# Patient Record
Sex: Female | Born: 1995
Health system: Southern US, Community
[De-identification: ages and names within clinical notes are randomized; demographics above are authoritative.]

## PROBLEM LIST (undated history)

## (undated) DIAGNOSIS — G43909 Migraine, unspecified, not intractable, without status migrainosus: Secondary | ICD-10-CM

## (undated) DIAGNOSIS — L309 Dermatitis, unspecified: Secondary | ICD-10-CM

## (undated) DIAGNOSIS — T7840XA Allergy, unspecified, initial encounter: Secondary | ICD-10-CM

## (undated) DIAGNOSIS — K589 Irritable bowel syndrome without diarrhea: Secondary | ICD-10-CM

## (undated) DIAGNOSIS — F329 Major depressive disorder, single episode, unspecified: Secondary | ICD-10-CM

## (undated) DIAGNOSIS — F32A Depression, unspecified: Secondary | ICD-10-CM

## (undated) HISTORY — DX: Irritable bowel syndrome without diarrhea: K58.9

## (undated) HISTORY — DX: Allergy, unspecified, initial encounter: T78.40XA

## (undated) HISTORY — DX: Migraine, unspecified, not intractable, without status migrainosus: G43.909

## (undated) HISTORY — DX: Depression, unspecified: F32.A

## (undated) HISTORY — DX: Dermatitis, unspecified: L30.9

## (undated) HISTORY — DX: Major depressive disorder, single episode, unspecified: F32.9

---
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
## 2017-01-17 DIAGNOSIS — L2084 Intrinsic (allergic) eczema: Secondary | ICD-10-CM | POA: Diagnosis not present

## 2017-01-26 ENCOUNTER — Encounter: Payer: 59 | Admitting: Physician Assistant

## 2017-01-26 DIAGNOSIS — R51 Headache: Secondary | ICD-10-CM

## 2017-01-29 MED FILL — TRIAMCINOLONE 0.1% CREAM: 0.1 | 30 days supply | Qty: 454 | Fill #0

## 2017-02-02 ENCOUNTER — Ambulatory Visit: Payer: 59 | Admitting: Family Medicine

## 2017-02-02 ENCOUNTER — Ambulatory Visit (INDEPENDENT_AMBULATORY_CARE_PROVIDER_SITE_OTHER): Payer: 59 | Admitting: Family Medicine

## 2017-02-02 ENCOUNTER — Encounter: Payer: Self-pay | Admitting: Family Medicine

## 2017-02-02 VITALS — BP 100/68 | HR 86 | Temp 97.9°F | Resp 16 | Ht 62.0 in | Wt 130.4 lb

## 2017-02-02 DIAGNOSIS — L600 Ingrowing nail: Secondary | ICD-10-CM | POA: Diagnosis not present

## 2017-02-02 DIAGNOSIS — R110 Nausea: Secondary | ICD-10-CM

## 2017-02-02 DIAGNOSIS — N946 Dysmenorrhea, unspecified: Secondary | ICD-10-CM

## 2017-02-02 DIAGNOSIS — K219 Gastro-esophageal reflux disease without esophagitis: Secondary | ICD-10-CM

## 2017-02-02 DIAGNOSIS — K130 Diseases of lips: Secondary | ICD-10-CM | POA: Diagnosis not present

## 2017-02-02 MED ORDER — ONDANSETRON HCL 4 MG PO TABS: 4.0000 mg | ORAL_TABLET | Freq: Three times a day (TID) | ORAL | 1 refills | Status: DC | PRN

## 2017-02-02 MED ORDER — CEPHALEXIN 500 MG PO CAPS: 500.0000 mg | ORAL_CAPSULE | Freq: Three times a day (TID) | ORAL | 0 refills | Status: AC

## 2017-02-02 MED ORDER — NORGESTIMATE-ETH ESTRADIOL 0.25-35 MG-MCG PO TABS: 1.0000 | ORAL_TABLET | Freq: Every day | ORAL | 4 refills | Status: DC

## 2017-02-02 MED FILL — ONDANSETRON HCL 4 MG TABLET: 4 | 10 days supply | Qty: 30 | Fill #0 | Status: TO

## 2017-02-02 MED FILL — CEPHALEXIN 500 MG CAPSULE: 500 | 7 days supply | Qty: 21 | Fill #0

## 2017-02-02 NOTE — Progress Notes (Signed)
   Subjective:    Patient ID: Danielle Hickman, female    DOB: 1995/09/01, 21 y.o.   MRN: [id number]  HPI Birth control- pt needs a refill on meds, leaving for college in 6 days  Ingrown toenail- pt went to a nail salon and continues to have pain, redness and swelling of L great toe  Angular cheilitis- pt has been using OTC 'yeast' cream (Monistat) but 'it's not completely cleared'.  AM nausea- pt asking for refill on Zofran.  Notes increased reflux.  Not taking anything   Review of Systems For ROS see HPI     Objective:   Physical Exam  Constitutional: She is oriented to person, place, and time. She appears well-developed and well-nourished. No distress.  HENT:  Head: Normocephalic and atraumatic.  Mouth/Throat: Oropharynx is clear and moist.  Neurological: She is alert and oriented to person, place, and time.  Skin: Skin is warm and dry. There is erythema (mild erythema along medial nail edge of L great toe w/ TTP and induration but no fluctuance).  Psychiatric: She has a normal mood and affect. Her behavior is normal. Thought content normal.  Vitals reviewed.         Assessment & Plan:  Dysmenorrhea- refill provided on OCPs  Angular Cheilitis- new.  Since Monistat is ineffective will switch to Clotrimazole twice daily.  Pt expressed understanding and is in agreement w/ plan.   Ingrown nail- start Keflex for mild infxn.  Encouraged podiatry f/u when she gets to school in [place] for definitive tx.  Pt expressed understanding and is in agreement w/ plan.   AM nausea- suspect this is due to GERD.  Start Zantac nightly and monitor for improvement.  Pt expressed understanding and is in agreement w/ plan.

## 2017-02-02 NOTE — Patient Instructions (Signed)
Schedule your complete physical for when you are home on break Continue the birth control daily Start the Keflex 3x/day x7 days for the ingrown toenail.  If this continues to occur, please see a podiatrist for definitive treatment Switch to OTC Clotrimazole for the corners of the mouth- apply twice daily Start nightly Ranitidine 175mg  (Zantac) to help w/ AM nausea Call with any questions or concerns GOOD LUCK AT SCHOOL!

## 2017-02-02 NOTE — Progress Notes (Signed)
Pre visit review using our clinic review tool, if applicable. No additional management support is needed unless otherwise documented below in the visit note. 

## 2017-03-05 DIAGNOSIS — Z3041 Encounter for surveillance of contraceptive pills: Secondary | ICD-10-CM | POA: Diagnosis not present

## 2017-03-05 DIAGNOSIS — N761 Subacute and chronic vaginitis: Secondary | ICD-10-CM | POA: Diagnosis not present

## 2017-03-05 DIAGNOSIS — Z113 Encounter for screening for infections with a predominantly sexual mode of transmission: Secondary | ICD-10-CM | POA: Diagnosis not present

## 2017-04-20 DIAGNOSIS — Z23 Encounter for immunization: Secondary | ICD-10-CM | POA: Diagnosis not present

## 2017-05-18 MED FILL — APRI 0.15MG/0.03MG 28 DAY: 0.15-30 | 84 days supply | Qty: 84 | Fill #0 | Status: TO

## 2017-06-14 ENCOUNTER — Encounter: Payer: 59 | Admitting: Family Medicine

## 2017-07-10 ENCOUNTER — Encounter: Payer: Self-pay | Admitting: Family Medicine

## 2017-07-10 ENCOUNTER — Ambulatory Visit (INDEPENDENT_AMBULATORY_CARE_PROVIDER_SITE_OTHER): Payer: 59 | Admitting: Family Medicine

## 2017-07-10 VITALS — BP 120/76 | HR 74 | Temp 98.2°F | Ht 62.0 in | Wt 133.0 lb

## 2017-07-10 DIAGNOSIS — Z0000 Encounter for general adult medical examination without abnormal findings: Secondary | ICD-10-CM

## 2017-07-10 DIAGNOSIS — Z3041 Encounter for surveillance of contraceptive pills: Secondary | ICD-10-CM

## 2017-07-10 NOTE — Progress Notes (Signed)
Subjective  Chief Complaint  Patient presents with  . Annual Exam    Not Fasting    HPI: Danielle Hickman is a 22 y.o. female who presents to [place] [facility] today for a Female Wellness Visit.   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 Spent a year in [place] last year; working and volunteering. LOVED it.  On Ocps without problems. Worries about larger left breast w/o mass or pain. Regular menses. Not currently sexually active but has been. No longer with dysmenorrhea. Has had GC/ch and HIV screen in sept at school; all negative. No high risk behaviors identified.  Parents are separating/divorcing. Feels in the middle and worries about mom who has a drinking problem. Plans on seeing therapist at school to help deal with it, but overall is handling it very well. Maintaining her independence and is very happy at school. Feels supported by parents.  Lifestyle: Body mass index is 24.33 kg/m. Wt Readings from Last 3 Encounters:  07/10/17 133 lb (60.3 kg) (58 %, Z= 0.21)*  02/02/17 130 lb 6 oz (59.1 kg) (55 %, Z= 0.14)*  06/20/16 129 lb (58.5 kg) (56 %, Z= 0.14)*   * Growth percentiles are based on CDC (Girls, 2-20 Years) data.   Diet: low fat Exercise: frequently, hiking Need for contraception: Yes, OCP (estrogen/progesterone)  Patient Active Problem List   Diagnosis Date Noted  . Oral contraceptive use 07/10/2017  . Muscle spasm 01/28/2016  . Physical exam 09/16/2015  . Vulval candidiasis 02/16/2015  . Vaginitis and vulvovaginitis 02/16/2015  . Acute tonsillitis 01/12/2014  . Dysmenorrhea 09/22/2013  . Inattention 07/23/2013  . URI (upper respiratory infection) 10/28/2012  . Depression 08/08/2011   Health Maintenance  Topic Date Due  . Samul Dada  02/25/2019  . INFLUENZA VACCINE  Completed  . HIV Screening  Completed   Immunization  History  Administered Date(s) Administered  . DTaP 10/19/1997, 01/01/1998, 02/25/1998, 02/23/1999, 02/24/2003  . HPV Quadrivalent 06/30/2011, 08/08/2011, 08/28/2012  . Hepatitis A 02/24/2009, 08/08/2011  . Hepatitis B 1995-10-17, 09/13/1997, 05/14/1998  . HiB (PRP-OMP) 10/19/1997, 01/01/1998, 02/23/1999  . IPV 10/19/1997, 01/01/1998, 08/22/1998, 02/24/2003  . Influenza Split 02/23/1999, 05/15/2007, 04/09/2008  . Influenza,inj,Quad PF,6+ Mos 03/26/2013  . Influenza-Unspecified 05/18/2017  . MMR 08/17/1998, 02/24/2003  . Meningococcal Conjugate 08/28/2012  . Pneumococcal Conjugate-13 02/23/1999  . Tdap 02/24/2009  . Varicella 03/23/2004, 02/24/2009   We updated and reviewed the patient's past history in detail and it is documented below. Allergies: Patient has No Known Allergies. Past Medical History Patient  has a past medical history of Allergy, Depression, and Eczema. Past Surgical History Patient  has no past surgical history on file. Family History: Patient family history includes Alcohol abuse in her mother; Cancer in her father; Heart disease in her other and paternal grandfather; Hyperlipidemia in her mother; Hypertension in her maternal grandmother; Migraines in her maternal grandmother and mother; Stroke in her paternal grandfather; Thyroid disease in her father and mother; Valvular heart disease (age of onset: 63) in her father. Social History:  Patient  reports that  has never smoked. she has never used smokeless tobacco. She reports that she does not drink alcohol or use drugs.  Review of Systems: Constitutional: negative for fever or malaise Ophthalmic: negative for photophobia, double vision or loss of vision Cardiovascular: negative for chest pain, dyspnea on exertion, or new LE swelling Respiratory: negative for SOB or  persistent cough Gastrointestinal: negative for abdominal pain, change in bowel habits or melena Genitourinary: negative for dysuria or gross  hematuria, no abnormal uterine bleeding or disharge Musculoskeletal: negative for new gait disturbance or muscular weakness Integumentary: negative for new or persistent rashes, no breast lumps Neurological: negative for TIA or stroke symptoms Psychiatric: negative for SI or delusions Allergic/Immunologic: negative for hives Patient Care Team    Relationship Specialty Notifications Start End  Midge Minium, MD PCP - General Family Medicine  07/19/12    Comment: Merged    Objective  Vitals: BP 120/76 (BP Location: Left Arm, Patient Position: Sitting, Cuff Size: Normal)   Pulse 74   Temp 98.2 F (36.8 C) (Oral)   Ht 5' 2"  (1.575 m)   Wt 133 lb (60.3 kg)   LMP 07/10/2017   SpO2 99%   BMI 24.33 kg/m  General:  Well developed, well nourished, no acute distress  Psych:  Alert and orientedx3,normal mood and affect HEENT:  Normocephalic, atraumatic, non-icteric sclera, PERRL, oropharynx is clear without mass or exudate, supple neck without adenopathy, mass or thyromegaly Cardiovascular:  Normal S1, S2, RRR without gallop, rub or murmur, nondisplaced PMI Respiratory:  Good breath sounds bilaterally, CTAB with normal respiratory effort Gastrointestinal: normal bowel sounds, soft, non-tender, no noted masses. No HSM MSK: no deformities, contusions. Joints are without erythema or swelling. Spine and CVA region are nontender Skin:  Warm, no rashes or suspicious lesions noted Neurologic:    Mental status is normal. CN 2-11 are normal. Gross motor and sensory exams are normal. Normal gait. No tremor Breast Exam: No mass, skin retraction or nipple discharge is appreciated in either breast. assymetric breast tissue, No axillary adenopathy. Fibrocystic changes are not noted Pelvic Exam: Normal external genitalia, no vulvar or vaginal lesions present. Clear cervix w/o CMT. Bimanual exam reveals a nontender fundus w/o masses, nl size. No adnexal masses present. No inguinal adenopathy. A PAP smear  was not performed.   Assessment  1. Physical exam   2. Oral contraceptive use      Plan  Female Wellness Visit:  Age appropriate Health Maintenance and Prevention measures were discussed with patient. Included topics are cancer screening recommendations, ways to keep healthy (see AVS) including dietary and exercise recommendations, regular eye and dental care, use of seat belts, and avoidance of moderate alcohol use and tobacco use.   BMI: discussed patient's BMI and encouraged positive lifestyle modifications to help get to or maintain a target BMI.  HM needs and immunizations were addressed and ordered. See below for orders. See HM and immunization section for updates.  Routine labs and screening tests ordered including cmp, cbc and lipids where appropriate.  Discussed recommendations regarding Vit D and calcium supplementation (see AVS)  Follow up: Return in about 12 months (around 07/10/2018) for complete physical.    Commons side effects, risks, benefits, and alternatives for medications and treatment plan prescribed today were discussed, and the patient expressed understanding of the given instructions. Patient is instructed to call or message via MyChart if he/she has any questions or concerns regarding our treatment plan. No barriers to understanding were identified. We discussed Red Flag symptoms and signs in detail. Patient expressed understanding regarding what to do in case of urgent or emergency type symptoms.   Medication list was reconciled, printed and provided to the patient in AVS. Patient instructions and summary information was reviewed with the patient as documented in the AVS. This note was prepared with assistance of Dragon voice recognition  software. Occasional wrong-word or sound-a-like substitutions may have occurred due to the inherent limitations of voice recognition software

## 2017-07-10 NOTE — Patient Instructions (Signed)
Please return in 1 year for your physical.  If you have any questions or concerns, please don't hesitate to send me a message via MyChart or call the office at [phone]. Thank you for visiting with [place] today! It's our pleasure caring for you.  Please do these things to maintain good health!   Exercise at least 30-45 minutes a day,  4-5 days a week.   Eat a low-fat diet with lots of fruits and vegetables, up to 7-9 servings per day.  Drink plenty of water daily. Try to drink 8 8oz glasses per day.  Seatbelts can save your life. Always wear your seatbelt.  Place Smoke Detectors on every level of your home and check batteries every year.  Schedule an appointment with an eye doctor for an eye exam every 1-2 years  Safe sex - use condoms to protect yourself from STDs if you could be exposed to these types of infections. Use birth control if you do not want to become pregnant and are sexually active.  Avoid heavy alcohol use. If you drink, keep it to less than 2 drinks/day and not every day.  [place].  Choose someone you trust that could speak for you if you became unable to speak for yourself.  Depression is common in our stressful world.If you're feeling down or losing interest in things you normally enjoy, please come in for a visit.  If anyone is threatening or hurting you, please get help. Physical or Emotional Violence is never OK.

## 2017-07-11 ENCOUNTER — Encounter: Payer: Self-pay | Admitting: Allergy and Immunology

## 2017-07-11 ENCOUNTER — Ambulatory Visit (INDEPENDENT_AMBULATORY_CARE_PROVIDER_SITE_OTHER): Payer: 59 | Admitting: Allergy and Immunology

## 2017-07-11 VITALS — BP 100/64 | HR 70 | Temp 98.2°F | Resp 16 | Ht 61.69 in | Wt 125.9 lb

## 2017-07-11 DIAGNOSIS — T781XXD Other adverse food reactions, not elsewhere classified, subsequent encounter: Secondary | ICD-10-CM | POA: Diagnosis not present

## 2017-07-11 DIAGNOSIS — T7800XD Anaphylactic reaction due to unspecified food, subsequent encounter: Secondary | ICD-10-CM

## 2017-07-11 DIAGNOSIS — T7800XA Anaphylactic reaction due to unspecified food, initial encounter: Secondary | ICD-10-CM | POA: Insufficient documentation

## 2017-07-11 DIAGNOSIS — J3089 Other allergic rhinitis: Secondary | ICD-10-CM

## 2017-07-11 DIAGNOSIS — T7819XA Other adverse food reactions, not elsewhere classified, initial encounter: Secondary | ICD-10-CM | POA: Insufficient documentation

## 2017-07-11 DIAGNOSIS — T781XXA Other adverse food reactions, not elsewhere classified, initial encounter: Secondary | ICD-10-CM | POA: Insufficient documentation

## 2017-07-11 DIAGNOSIS — H1013 Acute atopic conjunctivitis, bilateral: Secondary | ICD-10-CM | POA: Diagnosis not present

## 2017-07-11 DIAGNOSIS — H1010 Acute atopic conjunctivitis, unspecified eye: Secondary | ICD-10-CM | POA: Insufficient documentation

## 2017-07-11 MED ORDER — FLUTICASONE PROPIONATE 50 MCG/ACT NA SUSP: NASAL | 5 refills | Status: AC

## 2017-07-11 MED ORDER — OLOPATADINE HCL 0.2 % OP SOLN: 1.0000 [drp] | Freq: Every day | OPHTHALMIC | 5 refills | Status: DC | PRN

## 2017-07-11 MED ORDER — LEVOCETIRIZINE DIHYDROCHLORIDE 5 MG PO TABS: 5.0000 mg | ORAL_TABLET | Freq: Every evening | ORAL | 5 refills | Status: AC

## 2017-07-11 MED ORDER — EPINEPHRINE 0.3 MG/0.3ML IJ SOAJ: INTRAMUSCULAR | 3 refills | Status: AC

## 2017-07-11 NOTE — Progress Notes (Signed)
New Patient Note  RE: Danielle Hickman MRN: [id number] DOB: 09/13/1995 Date of Office Visit: 07/11/2017  Referring provider: Midge Minium, MD Primary care provider: Midge Minium, MD  Chief Complaint: Allergic Reaction; Food Intolerance; and Allergic Rhinitis    History of present illness: Danielle Hickman is a 22 y.o. female seen today in consultation requested by Annye Asa, MD.  She complains of oral pruritus with certain fruits.  She states that this all started approximately 3 years ago with raw apples.  She notes that she is able to consume cooked fruits, such as apple pie, without symptoms.  Over these past 3 years, the problem has progressed from apples to multiple fruits, including cherry, weekly, peach, pear, and plums.  In addition, she has oral pruritus with consumption of almonds and walnuts and soy milk.  On one occasion, early December 2018, she consumed 4 strawberries and experienced pain in her throat, ears, and chest.  She had elevated heart rate, became short of breath, developed nausea, and vomited.  She took diphenhydramine and went to bed.  When she woke up she had no symptoms. [place] experiences nasal congestion, rhinorrhea, sneezing, nasal pruritus, and ocular pruritus.  These symptoms are most frequent and severe in the springtime.  When the symptoms are severe, she attempts to control them with cetirizine as needed.   Assessment and plan: Food allergy The patient's history suggests food allergy and positive skin test results today confirm this diagnosis.    Meticulous avoidance of tree nuts, sesame seed, and soy as discussed.  In addition, she will avoid any fruits which cause untoward symptoms.  A prescription has been provided for epinephrine auto-injector 2 pack along with instructions for proper administration.  A food allergy action plan has been provided and discussed.  Medic Alert identification is recommended.  Oral allergy  syndrome Oral allergy syndrome.  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 Peeling or cooking the food has shown to reduce symptoms and antihistamines may also relieve symptoms. Immunotherapy to the cross reacting pollens has improved or cured OAS in many patients, though this has not been consistent for all patients. Typically OAS is limited to itching or swelling of mucosal tissues from the lips to the back of the throat.   Information about OAS has been discussed and provided in written form.  All foods causing symptoms are to be avoided.  Should symptoms progress beyond the mouth and throat, epinephrine is to be administered and 911 is to be called immediately.  Seasonal and perennial allergic rhinitis  Aeroallergen avoidance measures have been discussed and provided in written form.  A prescription has been provided for levocetirizine, 5 mg daily as needed.  A prescription has been provided for fluticasone nasal spray, one spray per nostril 1-2 times daily as needed. Proper nasal spray technique has been discussed and demonstrated.  Nasal saline spray (i.e., Simply Saline) or nasal saline lavage (i.e., NeilMed) is recommended as needed and prior to medicated nasal sprays.  If allergen avoidance measures and medications fail to adequately relieve symptoms, aeroallergen immunotherapy will be considered.  Allergic conjunctivitis  Treatment plan as outlined above for allergic rhinitis.  A prescription has been provided for Pataday, one drop per eye daily as needed.  I have also recommended eye lubricant drops (i.e., Natural Tears) as needed.   Meds ordered this encounter  Medications  . EPINEPHrine (AUVI-Q) 0.3 mg/0.3 mL IJ SOAJ injection  Sig: Use as directed for severe allergic reactions    Dispense:  4 Device    Refill:  3  . levocetirizine (XYZAL) 5 MG tablet    Sig: Take 1 tablet (5 mg total) by mouth every evening. As  needed    Dispense:  30 tablet    Refill:  5  . fluticasone (FLONASE) 50 MCG/ACT nasal spray    Sig: 1 spray 1-2 times a day as needed    Dispense:  16 g    Refill:  5  . Olopatadine HCl (PATADAY) 0.2 % SOLN    Sig: Apply 1 drop to eye daily as needed.    Dispense:  1 Bottle    Refill:  5    Diagnostics: Environmental skin testing: Positive to grass pollen, weed pollen, ragweed pollen, tree pollen, and dust mite antigen. Food allergen skin testing: Positive to soybean, pecan, walnut, almond, hazelnut, [place] nut, and sesame seed.    Physical examination: Blood pressure 100/64, pulse 70, temperature 98.2 F (36.8 C), temperature source Oral, resp. rate 16, height 5' 1.69" (1.567 m), weight 125 lb 14.1 oz (57.1 kg), last menstrual period 07/10/2017, SpO2 97 %.  General: Alert, interactive, in no acute distress. HEENT: TMs pearly gray, turbinates edematous with thick discharge, post-pharynx moderately erythematous. Neck: Supple without lymphadenopathy. Lungs: Clear to auscultation without wheezing, rhonchi or rales. CV: Normal S1, S2 without murmurs. Abdomen: Nondistended, nontender. Skin: Warm and dry, without lesions or rashes. Extremities:  No clubbing, cyanosis or edema. Neuro:   Grossly intact.  Review of systems:  Review of systems negative except as noted in HPI / PMHx or noted below: Review of Systems  Constitutional: Negative.   HENT: Negative.   Eyes: Negative.   Respiratory: Negative.   Cardiovascular: Negative.   Gastrointestinal: Negative.   Genitourinary: Negative.   Musculoskeletal: Negative.   Skin: Negative.   Neurological: Negative.   Endo/Heme/Allergies: Negative.   Psychiatric/Behavioral: Negative.     Past medical history:  Past Medical History:  Diagnosis Date  . Allergy   . Depression   . Eczema     Past surgical history:  History reviewed. No pertinent surgical history.  Family history: Family History  Problem Relation Age of Onset   . Migraines Mother   . Hyperlipidemia Mother   . Thyroid disease Mother   . Alcohol abuse Mother   . Allergic rhinitis Mother   . Thyroid disease Father        graves disease  . Cancer Father        colon  . Valvular heart disease Father 9  . Hypertension Maternal Grandmother   . Migraines Maternal Grandmother   . Heart disease Paternal Grandfather        AI---valve replacement  . Stroke Paternal Grandfather   . Heart disease Other        AI--valve replacement    Social history: Social History   Socioeconomic History  . Marital status: Single    Spouse name: Not on file  . Number of children: Not on file  . Years of education: Not on file  . Highest education level: Not on file  Social Needs  . Financial resource strain: Not on file  . Food insecurity - worry: Not on file  . Food insecurity - inability: Not on file  . Transportation needs - medical: Not on file  . Transportation needs - non-medical: Not on file  Occupational History  . Occupation: Systems analyst  Tobacco Use  .  Smoking status: Never Smoker  . Smokeless tobacco: Never Used  Substance and Sexual Activity  . Alcohol use: No  . Drug use: No  . Sexual activity: Not Currently    Birth control/protection: Condom, Pill  Other Topics Concern  . Not on file  Social History Narrative  . Not on file   Environmental History: The patient lives in a 20 year old house with hardwood floors throughout and central air/heat.  There is a cat in the house which has access to her bedroom.  She is a non-smoker.  There is mold/water damage in the home.  Allergies as of 07/11/2017      Reactions   Apple Itching, Swelling   Cherry    Kiwi Extract Hives, Itching, Swelling   Other Hives, Itching, Swelling   Allergic to walnut and almond   Peach [prunus Persica] Hives, Itching, Swelling   Pear    Plum Pulp Itching, Swelling      Medication List        Accurate as of 07/11/17  3:40 PM. Always  use your most recent med list.          EPINEPHrine 0.3 mg/0.3 mL Soaj injection Commonly known as:  AUVI-Q Use as directed for severe allergic reactions   fluticasone 50 MCG/ACT nasal spray Commonly known as:  FLONASE 1 spray 1-2 times a day as needed   IBUPROFEN IB PO Take by mouth as needed.   levocetirizine 5 MG tablet Commonly known as:  XYZAL Take 1 tablet (5 mg total) by mouth every evening. As needed   norgestimate-ethinyl estradiol 0.25-35 MG-MCG tablet Commonly known as:  MONO-LINYAH Take 1 tablet by mouth daily.   Olopatadine HCl 0.2 % Soln Commonly known as:  PATADAY Apply 1 drop to eye daily as needed.   ondansetron 4 MG tablet Commonly known as:  ZOFRAN Take 1 tablet (4 mg total) by mouth every 8 (eight) hours as needed for nausea or vomiting.       Known medication allergies: Allergies  Allergen Reactions  . Apple Itching and Swelling  . Ong   . Kiwi Extract Hives, Itching and Swelling  . Other Hives, Itching and Swelling    Allergic to walnut and almond  . Peach [Prunus Persica] Hives, Itching and Swelling  . Pear   . Plum Pulp Itching and Swelling    I appreciate the opportunity to take part in [facility] care. Please do not hesitate to contact me with questions.  Sincerely,   R. Edgar Frisk, MD

## 2017-07-11 NOTE — Assessment & Plan Note (Addendum)
The patient's history suggests food allergy and positive skin test results today confirm this diagnosis.    Meticulous avoidance of tree nuts, sesame seed, and soy as discussed.  In addition, she will avoid any fruits which cause untoward symptoms.  A prescription has been provided for epinephrine auto-injector 2 pack along with instructions for proper administration.  A food allergy action plan has been provided and discussed.  Medic Alert identification is recommended.

## 2017-07-11 NOTE — Assessment & Plan Note (Signed)
   Aeroallergen avoidance measures have been discussed and provided in written form.  A prescription has been provided for levocetirizine, 5 mg daily as needed.  A prescription has been provided for fluticasone nasal spray, one spray per nostril 1-2 times daily as needed. Proper nasal spray technique has been discussed and demonstrated.  Nasal saline spray (i.e., Simply Saline) or nasal saline lavage (i.e., NeilMed) is recommended as needed and prior to medicated nasal sprays.  If allergen avoidance measures and medications fail to adequately relieve symptoms, aeroallergen immunotherapy will be considered. 

## 2017-07-11 NOTE — Assessment & Plan Note (Signed)
Oral allergy syndrome.  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 Peeling or cooking the food has shown to reduce symptoms and antihistamines may also relieve symptoms. Immunotherapy to the cross reacting pollens has improved or cured OAS in many patients, though this has not been consistent for all patients. Typically OAS is limited to itching or swelling of mucosal tissues from the lips to the back of the throat.   Information about OAS has been discussed and provided in written form.  All foods causing symptoms are to be avoided.  Should symptoms progress beyond the mouth and throat, epinephrine is to be administered and 911 is to be called immediately. 

## 2017-07-11 NOTE — Patient Instructions (Addendum)
Food allergy The patient's history suggests food allergy and positive skin test results today confirm this diagnosis.    Meticulous avoidance of tree nuts, sesame seed, and soy as discussed.  In addition, she will avoid any fruits which cause untoward symptoms.  A prescription has been provided for epinephrine auto-injector 2 pack along with instructions for proper administration.  A food allergy action plan has been provided and discussed.  Medic Alert identification is recommended.  Oral allergy syndrome Oral allergy syndrome.  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 Peeling or cooking the food has shown to reduce symptoms and antihistamines may also relieve symptoms. Immunotherapy to the cross reacting pollens has improved or cured OAS in many patients, though this has not been consistent for all patients. Typically OAS is limited to itching or swelling of mucosal tissues from the lips to the back of the throat.   Information about OAS has been discussed and provided in written form.  All foods causing symptoms are to be avoided.  Should symptoms progress beyond the mouth and throat, epinephrine is to be administered and 911 is to be called immediately.  Seasonal and perennial allergic rhinitis  Aeroallergen avoidance measures have been discussed and provided in written form.  A prescription has been provided for levocetirizine, 5 mg daily as needed.  A prescription has been provided for fluticasone nasal spray, one spray per nostril 1-2 times daily as needed. Proper nasal spray technique has been discussed and demonstrated.  Nasal saline spray (i.e., Simply Saline) or nasal saline lavage (i.e., NeilMed) is recommended as needed and prior to medicated nasal sprays.  If allergen avoidance measures and medications fail to adequately relieve symptoms, aeroallergen immunotherapy will be considered.  Allergic  conjunctivitis  Treatment plan as outlined above for allergic rhinitis.  A prescription has been provided for Pataday, one drop per eye daily as needed.  I have also recommended eye lubricant drops (i.e., Natural Tears) as needed.   Return in about 1 year (around 07/11/2018), or if symptoms worsen or fail to improve.  Reducing Pollen Exposure  The American Academy of Allergy, Asthma and Immunology suggests the following steps to reduce your exposure to pollen during allergy seasons.    1. Do not hang sheets or clothing out to dry; pollen may collect on these items. 2. Do not mow lawns or spend time around freshly cut grass; mowing stirs up pollen. 3. Keep windows closed at night.  Keep car windows closed while driving. 4. Minimize morning activities outdoors, a time when pollen counts are usually at their highest. 5. Stay indoors as much as possible when pollen counts or humidity is high and on windy days when pollen tends to remain in the air longer. 6. Use air conditioning when possible.  Many air conditioners have filters that trap the pollen spores. 7. Use a HEPA room air filter to remove pollen form the indoor air you breathe.   Control of House Dust Mite Allergen  House dust mites play a major role in allergic asthma and rhinitis.  They occur in environments with high humidity wherever human skin, the food for dust mites is found. High levels have been detected in dust obtained from mattresses, pillows, carpets, upholstered furniture, bed covers, clothes and soft toys.  The principal allergen of the house dust mite is found in its feces.  A gram of dust may contain 1,000 mites and 250,000 fecal particles.  Mite antigen is easily measured  in the air during house cleaning activities.    1. Encase mattresses, including the box spring, and pillow, in an air tight cover.  Seal the zipper end of the encased mattresses with wide adhesive tape. 2. Wash the bedding in water of 130 degrees  Farenheit weekly.  Avoid cotton comforters/quilts and flannel bedding: the most ideal bed covering is the dacron comforter. 3. Remove all upholstered furniture from the bedroom. 4. Remove carpets, carpet padding, rugs, and non-washable window drapes from the bedroom.  Wash drapes weekly or use plastic window coverings. 5. Remove all non-washable stuffed toys from the bedroom.  Wash stuffed toys weekly. 6. Have the room cleaned frequently with a vacuum cleaner and a damp dust-mop.  The patient should not be in a room which is being cleaned and should wait 1 hour after cleaning before going into the room. 7. Close and seal all heating outlets in the bedroom.  Otherwise, the room will become filled with dust-laden air.  An electric heater can be used to heat the room. 8. Reduce indoor humidity to less than 50%.  Do not use a humidifier.

## 2017-07-11 NOTE — Assessment & Plan Note (Signed)
   Treatment plan as outlined above for allergic rhinitis.  A prescription has been provided for Pataday, one drop per eye daily as needed.  I have also recommended eye lubricant drops (i.e., Natural Tears) as needed. 

## 2017-07-12 MED FILL — OLOPATADINE HCL 0.2 % SOLN: 0.2 | 25 days supply | Qty: 3 | Fill #0

## 2017-07-12 MED FILL — LEVOCETIRIZINE 5 MG TABLET: 5 | 30 days supply | Qty: 30 | Fill #0

## 2017-07-12 MED FILL — FLUTICASONE PROP 50 MCG SPR: 50 | 30 days supply | Qty: 16 | Fill #0

## 2017-07-16 ENCOUNTER — Ambulatory Visit: Payer: 59 | Admitting: Allergy and Immunology

## 2017-07-22 DIAGNOSIS — F418 Other specified anxiety disorders: Secondary | ICD-10-CM | POA: Diagnosis not present

## 2017-07-22 DIAGNOSIS — T7840XA Allergy, unspecified, initial encounter: Secondary | ICD-10-CM | POA: Diagnosis not present

## 2017-07-22 DIAGNOSIS — R197 Diarrhea, unspecified: Secondary | ICD-10-CM | POA: Diagnosis not present

## 2017-07-22 DIAGNOSIS — R112 Nausea with vomiting, unspecified: Secondary | ICD-10-CM | POA: Diagnosis not present

## 2017-07-23 DIAGNOSIS — T7840XA Allergy, unspecified, initial encounter: Secondary | ICD-10-CM | POA: Diagnosis not present

## 2017-09-03 DIAGNOSIS — J4540 Moderate persistent asthma, uncomplicated: Secondary | ICD-10-CM | POA: Diagnosis not present

## 2017-09-03 DIAGNOSIS — J301 Allergic rhinitis due to pollen: Secondary | ICD-10-CM | POA: Diagnosis not present

## 2017-09-03 DIAGNOSIS — L209 Atopic dermatitis, unspecified: Secondary | ICD-10-CM | POA: Diagnosis not present

## 2017-09-03 DIAGNOSIS — Z91018 Allergy to other foods: Secondary | ICD-10-CM | POA: Diagnosis not present

## 2017-09-03 DIAGNOSIS — G43909 Migraine, unspecified, not intractable, without status migrainosus: Secondary | ICD-10-CM | POA: Diagnosis not present

## 2017-09-04 DIAGNOSIS — Z91018 Allergy to other foods: Secondary | ICD-10-CM | POA: Diagnosis not present

## 2017-09-13 ENCOUNTER — Encounter: Payer: Self-pay | Admitting: Family Medicine

## 2017-09-13 ENCOUNTER — Ambulatory Visit (INDEPENDENT_AMBULATORY_CARE_PROVIDER_SITE_OTHER): Payer: 59 | Admitting: Family Medicine

## 2017-09-13 ENCOUNTER — Other Ambulatory Visit (HOSPITAL_COMMUNITY)
Admission: RE | Admit: 2017-09-13 | Discharge: 2017-09-13 | Disposition: A | Discharge status: Home / Self Care | Payer: 59 | Source: Ambulatory Visit | Attending: Family Medicine | Admitting: Family Medicine

## 2017-09-13 ENCOUNTER — Other Ambulatory Visit: Payer: Self-pay

## 2017-09-13 VITALS — BP 100/70 | HR 67 | Temp 98.2°F | Resp 16 | Ht 62.0 in | Wt 126.0 lb

## 2017-09-13 DIAGNOSIS — F329 Major depressive disorder, single episode, unspecified: Secondary | ICD-10-CM | POA: Diagnosis not present

## 2017-09-13 DIAGNOSIS — R5383 Other fatigue: Secondary | ICD-10-CM

## 2017-09-13 DIAGNOSIS — F32A Depression, unspecified: Secondary | ICD-10-CM

## 2017-09-13 DIAGNOSIS — Z202 Contact with and (suspected) exposure to infections with a predominantly sexual mode of transmission: Secondary | ICD-10-CM | POA: Diagnosis not present

## 2017-09-13 LAB — CBC WITH DIFFERENTIAL/PLATELET
Basophils Absolute: 0 10*3/uL (ref 0.0–0.1)
Basophils Relative: 0.7 % (ref 0.0–3.0)
Eosinophils Absolute: 0.3 10*3/uL (ref 0.0–0.7)
Eosinophils Relative: 4 % (ref 0.0–5.0)
HCT: 41.3 % (ref 36.0–46.0)
Hemoglobin: 14.1 g/dL (ref 12.0–15.0)
Lymphocytes Relative: 27.3 % (ref 12.0–46.0)
Lymphs Abs: 1.8 10*3/uL (ref 0.7–4.0)
MCHC: 34.1 g/dL (ref 30.0–36.0)
MCV: 89.1 fl (ref 78.0–100.0)
Monocytes Absolute: 0.4 10*3/uL (ref 0.1–1.0)
Monocytes Relative: 6.3 % (ref 3.0–12.0)
Neutro Abs: 4.1 10*3/uL (ref 1.4–7.7)
Neutrophils Relative %: 61.7 % (ref 43.0–77.0)
Platelets: 288 10*3/uL (ref 150.0–400.0)
RBC: 4.64 Mil/uL (ref 3.87–5.11)
RDW: 12.5 % (ref 11.5–14.6)
WBC: 6.7 10*3/uL (ref 4.5–10.5)

## 2017-09-13 LAB — B12 AND FOLATE PANEL
Folate: 13 ng/mL (ref >5.9)
Vitamin B-12: 182 pg/mL — ABNORMAL LOW (ref 211–911)

## 2017-09-13 LAB — TSH: TSH: 2.75 u[IU]/mL (ref 0.35–5.50)

## 2017-09-13 LAB — VITAMIN D 25 HYDROXY (VIT D DEFICIENCY, FRACTURES): VITD: 22.72 ng/mL — ABNORMAL LOW (ref 30.00–100.00)

## 2017-09-13 NOTE — Progress Notes (Signed)
   Subjective:    Patient ID: Danielle Hickman, female    DOB: 1996-05-10, 22 y.o.   MRN: [id number]  HPI Pt was dx'd w/ multiple food allergies in January.  Pt has been extremely stressed b/c allergist told her, 'don't go to sleep after taking Benadryl bc the Benadryl can wear off and the reaction can kill you'  This has been 'the framework of my anxiety for the last few months'.  Then found out that parents are separating.  Returned to college and 'it was WAY harder'.  Pt is now having school anxiety, food anxiety, and stress at home.  'I don't feel safe in my own body'.  Waking up w/ headaches that don't improve w/ ibuprofen or zofran.  She doesn't describe them as migraines.  'very crippling anxiety'.  Pt is seeing a counselor in [place] who recommended she be checked for thyroid issues b/c of ongoing fatigue.  Pt has an appt upcoming w/ psychiatrist.   Review of Systems For ROS see HPI     Objective:   Physical Exam  Constitutional: She is oriented to person, place, and time. She appears well-developed and well-nourished. No distress.  HENT:  Head: Normocephalic and atraumatic.  Eyes: Pupils are equal, round, and reactive to light. Conjunctivae and EOM are normal.  Neck: Normal range of motion. Neck supple. No thyromegaly present.  Cardiovascular: Normal rate, regular rhythm, normal heart sounds and intact distal pulses.  No murmur heard. Pulmonary/Chest: Effort normal and breath sounds normal. No respiratory distress.  Musculoskeletal: She exhibits no edema.  Lymphadenopathy:    She has no cervical adenopathy.  Neurological: She is alert and oriented to person, place, and time.  Skin: Skin is warm and dry.  Psychiatric: She has a normal mood and affect. Her behavior is normal.          Assessment & Plan:  Fatigue- new.  Suspect this is related to her anxiety/depression but pt's counselor wanted her tested for possible contributing metabolic issues (thyroid, anemia, etc).  Labs  ordered and will treat underlying issues as needed.  Pt expressed understanding and is in agreement w/ plan.   Possible exposure to STDs- pt would like STD screening today.  Strongly encouraged use of condoms at each sexual encounter

## 2017-09-13 NOTE — Patient Instructions (Signed)
Follow up as needed or as scheduled We'll notify you of your lab results and make any changes if needed Continue to work with your counselor- this is a lot to deal with I wholly support seeing a psychiatrist b/c I think medication would be helpful A lot of what you're feeling physically, is due to your high stress levels- headaches, nausea, fatigue. Call with any questions or concerns YOU CAN DO THIS!!!!

## 2017-09-14 ENCOUNTER — Telehealth: Payer: 59 | Admitting: Family

## 2017-09-14 DIAGNOSIS — L259 Unspecified contact dermatitis, unspecified cause: Secondary | ICD-10-CM

## 2017-09-14 LAB — BASIC METABOLIC PANEL
BUN: 8 mg/dL (ref 6–23)
CO2: 29 mEq/L (ref 19–32)
Calcium: 9.4 mg/dL (ref 8.4–10.5)
Chloride: 105 mEq/L (ref 96–112)
Creatinine, Ser: 0.76 mg/dL (ref 0.40–1.20)
GFR: 103.04 mL/min (ref >60.00)
Glucose, Bld: 79 mg/dL (ref 70–99)
Potassium: 4.4 mEq/L (ref 3.5–5.1)
Sodium: 140 mEq/L (ref 135–145)

## 2017-09-14 LAB — URINE CYTOLOGY ANCILLARY ONLY
Chlamydia: NEGATIVE
Neisseria Gonorrhea: NEGATIVE

## 2017-09-14 LAB — RPR: RPR Ser Ql: NONREACTIVE

## 2017-09-14 LAB — HIV ANTIBODY (ROUTINE TESTING W REFLEX): HIV 1&2 Ab, 4th Generation: NONREACTIVE

## 2017-09-14 MED ORDER — PREDNISONE 5 MG PO TABS: 5.0000 mg | ORAL_TABLET | ORAL | 0 refills | Status: DC

## 2017-09-14 NOTE — Progress Notes (Signed)
Thank you for the details you included in the comment boxes. Those details are very helpful in determining the best course of treatment for you and help [place] to provide the best care. I attempted to reach you via telephone as this is a difficult rash to determine from the photos. It appears as though it may be bug bites (the image quality is not that great in Epic), but could also be an allergic reaction to soap, detergent, or something you ate or drank. That being said, prednisone would be a treatment which would address all of those causes. As far as cream, typically the oral steroid will work without topical cream as we do not know if the rash is coming from the inside out or from the outside in (skin irritation vs. Toxins/allergy the body is pushing out).  E Visit for Rash  We are sorry that you are not feeling well. Here is how we plan to help!  Based on what you shared with me it looks like you have contact dermatitis.  Contact dermatitis is a skin rash caused by something that touches the skin and causes irritation or inflammation.  Your skin may be red, swollen, dry, cracked, and itch.  The rash should go away in a few days but can last a few weeks.  If you get a rash, it's important to figure out what caused it so the irritant can be avoided in the future. and I have prescribed Prednisone 5mg  dose pack, tapering daily with tabs starting at 6 doing down to 1. Such as: Day 1 (6), Day 2 (5), Day 3 (4), and so on.    HOME CARE:   Take cool showers and avoid direct sunlight.  Apply cool compress or wet dressings.  Take a bath in an oatmeal bath.  Sprinkle content of one Aveeno packet under running faucet with comfortably warm water.  Bathe for 15-20 minutes, 1-2 times daily.  Pat dry with a towel. Do not rub the rash.  Use hydrocortisone cream.  Take an antihistamine like Benadryl for widespread rashes that itch.  The adult dose of Benadryl is 25-50 mg by mouth 4 times daily.  Caution:  This  type of medication may cause sleepiness.  Do not drink alcohol, drive, or operate dangerous machinery while taking antihistamines.  Do not take these medications if you have prostate enlargement.  Read package instructions thoroughly on all medications that you take.  GET HELP RIGHT AWAY IF:   Symptoms don't go away after treatment.  Severe itching that persists.  If you rash spreads or swells.  If you rash begins to smell.  If it blisters and opens or develops a yellow-brown crust.  You develop a fever.  You have a sore throat.  You become short of breath.  MAKE SURE YOU:  Understand these instructions. Will watch your condition. Will get help right away if you are not doing well or get worse.  Thank you for choosing an e-visit. Your e-visit answers were reviewed by a board certified advanced clinical practitioner to complete your personal care plan. Depending upon the condition, your plan could have included both over the counter or prescription medications. Please review your pharmacy choice. Be sure that the pharmacy you have chosen is open so that you can pick up your prescription now.  If there is a problem you may message your provider in [place] to have the prescription routed to another pharmacy. Your safety is important to [place]. If you have drug  allergies check your prescription carefully.  For the next 24 hours, you can use MyChart to ask questions about today's visit, request a non-urgent call back, or ask for a work or school excuse from your e-visit provider. You will get an email in the next two days asking about your experience. I hope that your e-visit has been valuable and will speed your recovery.

## 2017-09-17 ENCOUNTER — Other Ambulatory Visit: Payer: Self-pay | Admitting: General Practice

## 2017-09-17 MED ORDER — VITAMIN D (ERGOCALCIFEROL) 1.25 MG (50000 UNIT) PO CAPS: 50000.0000 [IU] | ORAL_CAPSULE | ORAL | 0 refills | Status: DC

## 2017-09-23 NOTE — Assessment & Plan Note (Signed)
Deteriorated.  Pt has hx of this previously but things have worsened her new allergy issues as well as her parent's separation.  She is seeing a Social worker in [place] but she is not interested in medication at this time.  Discussed that her fatigue and headaches could be a manifestation of her anxiety/depression.  She is scheduled to see a psychiatrist in [place].  Cautioned her that if her headaches don't improve w/ treatment of her depression/anxiety- we will need additional w/u.  Pt expressed understanding and is in agreement w/ plan.

## 2017-10-02 DIAGNOSIS — G43909 Migraine, unspecified, not intractable, without status migrainosus: Secondary | ICD-10-CM | POA: Diagnosis not present

## 2017-10-02 DIAGNOSIS — Z91018 Allergy to other foods: Secondary | ICD-10-CM | POA: Diagnosis not present

## 2017-10-02 DIAGNOSIS — J4520 Mild intermittent asthma, uncomplicated: Secondary | ICD-10-CM | POA: Diagnosis not present

## 2017-10-02 DIAGNOSIS — J301 Allergic rhinitis due to pollen: Secondary | ICD-10-CM | POA: Diagnosis not present

## 2017-10-11 DIAGNOSIS — R110 Nausea: Secondary | ICD-10-CM | POA: Diagnosis not present

## 2017-10-11 DIAGNOSIS — K30 Functional dyspepsia: Secondary | ICD-10-CM | POA: Diagnosis not present

## 2017-10-11 DIAGNOSIS — R51 Headache: Secondary | ICD-10-CM | POA: Diagnosis not present

## 2017-11-12 ENCOUNTER — Encounter: Payer: Self-pay | Admitting: Family Medicine

## 2017-11-24 ENCOUNTER — Telehealth: Payer: 59 | Admitting: Family

## 2017-11-24 DIAGNOSIS — L089 Local infection of the skin and subcutaneous tissue, unspecified: Secondary | ICD-10-CM | POA: Diagnosis not present

## 2017-11-24 MED ORDER — MUPIROCIN 2 % EX OINT: 1.0000 "application " | TOPICAL_OINTMENT | Freq: Two times a day (BID) | CUTANEOUS | 0 refills | Status: DC

## 2017-11-24 NOTE — Progress Notes (Signed)
E Visit for Rash  We are sorry that you are not feeling well. Here is how we plan to help!    Based upon what you have shared with me it looks like you have a bacterial infection of your piercing.It  Can cause inflammation of the skin and is treated with an antibiotic. I have prescribed:  Topical mupiricin   HOME CARE:   Take cool showers and avoid direct sunlight.  Apply cool compress or wet dressings.  Take a bath in an oatmeaal bath.  Sprinkle content of one Aveeno packet under running faucet with comfortably warm water.  Bathe for 15-20 minutes, 1-2 times daily.  Pat dry with a towel. Do not rub the rash.  Use hydrocortisone cream.  Take an antihistamine like Benadryl for widespread rashes that itch.  The adult dose of Benadryl is 25-50 mg by mouth 4 times daily.  Caution:  This type of medication may cause sleepiness.  Do not drink alcohol, drive, or operate dangerous machinery while taking antihistamines.  Do not take these medications if you have prostate enlargement.  Read package instructions thoroughly on all medications that you take.  GET HELP RIGHT AWAY IF:   Symptoms don't go away after treatment.  Severe itching that persists.  If you rash spreads or swells.  If you rash begins to smell.  If it blisters and opens or develops a yellow-brown crust.  You develop a fever.  You have a sore throat.  You become short of breath.  MAKE SURE YOU:  Understand these instructions. Will watch your condition. Will get help right away if you are not doing well or get worse.  Thank you for choosing an e-visit. Your e-visit answers were reviewed by a board certified advanced clinical practitioner to complete your personal care plan. Depending upon the condition, your plan could have included both over the counter or prescription medications. Please review your pharmacy choice. Be sure that the pharmacy you have chosen is open so that you can pick up your prescription  now.  If there is a problem you may message your provider in [place] to have the prescription routed to another pharmacy. Your safety is important to [place]. If you have drug allergies check your prescription carefully.  For the next 24 hours, you can use MyChart to ask questions about today's visit, request a non-urgent call back, or ask for a work or school excuse from your e-visit provider. You will get an email in the next two days asking about your experience. I hope that your e-visit has been valuable and will speed your recovery.

## 2017-11-26 MED FILL — MUPIROCIN 2% OINTMENT: 2 | 10 days supply | Qty: 22 | Fill #0

## 2017-11-30 MED FILL — APRI 0.15MG/0.03MG 28 DAY: 0.15-30 | 84 days supply | Qty: 84 | Fill #0 | Status: TO

## 2017-12-05 MED FILL — LEVOCETIRIZINE 5 MG TABLET: 5 | 30 days supply | Qty: 30 | Fill #1

## 2017-12-21 ENCOUNTER — Telehealth: Payer: 59 | Admitting: Family

## 2017-12-21 DIAGNOSIS — L259 Unspecified contact dermatitis, unspecified cause: Secondary | ICD-10-CM

## 2017-12-21 DIAGNOSIS — R21 Rash and other nonspecific skin eruption: Secondary | ICD-10-CM | POA: Diagnosis not present

## 2017-12-21 MED ORDER — PREDNISONE 5 MG PO TABS: 5.0000 mg | ORAL_TABLET | ORAL | 0 refills | Status: DC

## 2017-12-21 NOTE — Progress Notes (Signed)
Thank you for the details you included in the comment boxes. Those details are very helpful in determining the best course of treatment for you and help [place] to provide the best care. This is more than likely a systemic allergic reaction given the rash on your arms also. The main treatment for this is prednisone as you will see below. You have allergies to many things so this is difficult to determine what ti is. Keep taking the Xyzal, and you can use flonase for the nasal symptoms. See plan below.  E Visit for Rash  We are sorry that you are not feeling well. Here is how we plan to help!    Based on what you shared with me you may have a virus or an allergic reaction.  Avoid contact with pregnant women until a diagnosis is made.  Most viral rashes are contagious (especially if a fever is present).  You can return to work or school after the rash is gone or when your doctor says it is safe to return with the rash.    Prednisone 5 mg daily for 6 days (see taper instructions below)  Directions for 6 day taper: Day 1: 2 tablets before breakfast, 1 after both lunch & dinner and 2 at bedtime Day 2: 1 tab before breakfast, 1 after both lunch & dinner and 2 at bedtime Day 3: 1 tab at each meal & 1 at bedtime Day 4: 1 tab at breakfast, 1 at lunch, 1 at bedtime Day 5: 1 tab at breakfast & 1 tab at bedtime Day 6: 1 tab at breakfast   HOME CARE:   Take cool showers and avoid direct sunlight.  Apply cool compress or wet dressings.  Take a bath in an oatmeal bath.  Sprinkle content of one Aveeno packet under running faucet with comfortably warm water.  Bathe for 15-20 minutes, 1-2 times daily.  Pat dry with a towel. Do not rub the rash.  Use hydrocortisone cream.  Take an antihistamine like Benadryl for widespread rashes that itch.  The adult dose of Benadryl is 25-50 mg by mouth 4 times daily.  Caution:  This type of medication may cause sleepiness.  Do not drink alcohol, drive, or operate  dangerous machinery while taking antihistamines.  Do not take these medications if you have prostate enlargement.  Read package instructions thoroughly on all medications that you take.  GET HELP RIGHT AWAY IF:   Symptoms don't go away after treatment.  Severe itching that persists.  If you rash spreads or swells.  If you rash begins to smell.  If it blisters and opens or develops a yellow-brown crust.  You develop a fever.  You have a sore throat.  You become short of breath.  MAKE SURE YOU:  Understand these instructions. Will watch your condition. Will get help right away if you are not doing well or get worse.  Thank you for choosing an e-visit. Your e-visit answers were reviewed by a board certified advanced clinical practitioner to complete your personal care plan. Depending upon the condition, your plan could have included both over the counter or prescription medications. Please review your pharmacy choice. Be sure that the pharmacy you have chosen is open so that you can pick up your prescription now.  If there is a problem you may message your provider in [place] to have the prescription routed to another pharmacy. Your safety is important to [place]. If you have drug allergies check your prescription carefully.  For  the next 24 hours, you can use MyChart to ask questions about today's visit, request a non-urgent call back, or ask for a work or school excuse from your e-visit provider. You will get an email in the next two days asking about your experience. I hope that your e-visit has been valuable and will speed your recovery.

## 2017-12-30 ENCOUNTER — Telehealth: Payer: 59 | Admitting: Family

## 2017-12-30 DIAGNOSIS — J0100 Acute maxillary sinusitis, unspecified: Secondary | ICD-10-CM | POA: Diagnosis not present

## 2017-12-30 DIAGNOSIS — J029 Acute pharyngitis, unspecified: Secondary | ICD-10-CM

## 2017-12-30 NOTE — Progress Notes (Signed)
Based on what you shared with me it looks like you have a serious condition that should be evaluated in a face to face office visit.  We usually do not send in medications outside of the state.   NOTE: If you entered your credit card information for this eVisit, you will not be charged. You may see a "hold" on your card for the $30 but that hold will drop off and you will not have a charge processed.  If you are having a true medical emergency please call 911.  If you need an urgent face to face visit, [place] has four urgent care centers for your convenience.  If you need care fast and have a high deductible or no insurance consider:   [email or web address] to reserve your spot online an avoid wait times  [facility] [address] [place], [place] [address] 8 am to 8 pm Monday-Friday 10 am to 4 pm Saturday-Sunday *Across the street from [employer]  [place], [address] 8 am to 5 pm Monday-Friday * In the [facility] on the [facility]   The following sites will take your  insurance:  . [facility] Health Urgent [place] a Provider at this Location  [address] [place], [place] [address] . 10 am to 8 pm Monday-Friday . 12 pm to 8 pm Saturday-Sunday   . [facility] Health Urgent Care at [place] a Provider at this Location  [place], [place], [place] [address] . 8 am to 8 pm Monday-Friday . 9 am to 6 pm Saturday . 11 am to 6 pm Sunday   . [facility] Health Urgent Care at [place] Get Driving Directions  [address] Suite [place], [place] [address] . 8 am to 8 pm Monday-Friday . 8 am to 4 pm Saturday-Sunday   Your e-visit answers were reviewed by a board certified advanced clinical practitioner to complete your personal care plan.  Thank you for using  e-Visits.

## 2018-02-04 MED FILL — LEVOCETIRIZINE 5 MG TABLET: 5 | 30 days supply | Qty: 30 | Fill #2

## 2018-02-13 DIAGNOSIS — Z3041 Encounter for surveillance of contraceptive pills: Secondary | ICD-10-CM | POA: Diagnosis not present

## 2018-04-12 DIAGNOSIS — T63441A Toxic effect of venom of bees, accidental (unintentional), initial encounter: Secondary | ICD-10-CM | POA: Diagnosis not present

## 2018-04-12 DIAGNOSIS — J069 Acute upper respiratory infection, unspecified: Secondary | ICD-10-CM | POA: Diagnosis not present

## 2018-07-11 ENCOUNTER — Encounter: Payer: 59 | Admitting: Family Medicine

## 2018-07-11 ENCOUNTER — Ambulatory Visit: Payer: 59 | Admitting: Allergy and Immunology

## 2018-07-19 DIAGNOSIS — Z114 Encounter for screening for human immunodeficiency virus [HIV]: Secondary | ICD-10-CM | POA: Diagnosis not present

## 2018-07-19 DIAGNOSIS — Z113 Encounter for screening for infections with a predominantly sexual mode of transmission: Secondary | ICD-10-CM | POA: Diagnosis not present

## 2018-08-07 ENCOUNTER — Telehealth: Payer: 59 | Admitting: Nurse Practitioner

## 2018-08-07 DIAGNOSIS — J00 Acute nasopharyngitis [common cold]: Secondary | ICD-10-CM

## 2018-08-07 NOTE — Progress Notes (Signed)
We are sorry you are not feeling well.  Here is how we plan to help!  Based on what you have shared with me, it looks like you may have a viral upper respiratory infection or a "common cold".  Colds are caused by a large number of viruses; however, rhinovirus is the most common cause.   Symptoms of the common cold vary from person to person, with common symptoms including sore throat, cough, and malaise.  A low-grade fever of 100.4 may present, but is often uncommon.  Symptoms vary however, and are closely related to a person's age or underlying illnesses.  The most common symptoms associated with the common cold are nasal discharge or congestion, cough, sneezing, headache and pressure in the ears and face.  Cold symptoms usually persist for about 3 to 10 days, but can last up to 2 weeks.  It is important to know that colds do not cause serious illness or complications in most cases.    The common cold is transmitted from person to person, with the most common method of transmission being a person's hands.  The virus is able to live on the skin and can infect other persons for up to 2 hours after direct contact.  Also, colds are transmitted when someone coughs or sneezes; thus, it is important to cover the mouth to reduce this risk.  To keep the spread of the common cold at bay, good hand hygiene is very important.  This is an infection that is most likely caused by a virus. There are no specific treatments for the common cold other than to help you with the symptoms until the infection runs its course.    For nasal congestion, you may use an oral decongestants such as Mucinex D or if you have glaucoma or high blood pressure use plain Mucinex.  Saline nasal spray or nasal drops can help and can safely be used as often as needed for congestion.  For your congestion, I have prescribed Fluticasone nasal spray one spray in each nostril twice a day  If you do not have a history of heart disease, hypertension,  diabetes or thyroid disease, prostate/bladder issues or glaucoma, you may also use Sudafed to treat nasal congestion.  It is highly recommended that you consult with a pharmacist or your primary care physician to ensure this medication is safe for you to take.     If you have a cough, you may use cough suppressants such as Delsym and Robitussin.  If you have glaucoma or high blood pressure, you can also use Coricidin HBP.     If you have a sore or scratchy throat, use a saltwater gargle-  to  teaspoon of salt dissolved in a 4-ounce to 8-ounce glass of warm water.  Gargle the solution for approximately 15-30 seconds and then spit.  It is important not to swallow the solution.  You can also use throat lozenges/cough drops and Chloraseptic spray to help with throat pain or discomfort.  Warm or cold liquids can also be helpful in relieving throat pain.  For headache, pain or general discomfort, you can use Ibuprofen or Tylenol as directed.   Some authorities believe that zinc sprays or the use of Echinacea may shorten the course of your symptoms.   HOME CARE . Only take medications as instructed by your medical team. . Be sure to drink plenty of fluids. Water is fine as well as fruit juices, sodas and electrolyte beverages. You may want to stay   away from caffeine or alcohol. If you are nauseated, try taking small sips of liquids. How do you know if you are getting enough fluid? Your urine should be a pale yellow or almost colorless. . Get rest. . Taking a steamy shower or using a humidifier may help nasal congestion and ease sore throat pain. You can place a towel over your head and breathe in the steam from hot water coming from a faucet. . Using a saline nasal spray works much the same way. . Cough drops, hard candies and sore throat lozenges may ease your cough. . Avoid close contacts especially the very young and the elderly . Cover your mouth if you cough or sneeze . Always remember to wash  your hands.   GET HELP RIGHT AWAY IF: . You develop worsening fever. . If your symptoms do not improve within 10 days . You develop yellow or green discharge from your nose over 3 days. . You have coughing fits . You develop a severe head ache or visual changes. . You develop shortness of breath, difficulty breathing or start having chest pain . Your symptoms persist after you have completed your treatment plan  MAKE SURE YOU   Understand these instructions.  Will watch your condition.  Will get help right away if you are not doing well or get worse.  Your e-visit answers were reviewed by a board certified advanced clinical practitioner to complete your personal care plan. Depending upon the condition, your plan could have included both over the counter or prescription medications. Please review your pharmacy choice. If there is a problem, you may call our nursing hot line at and have the prescription routed to another pharmacy. Your safety is important to [place]. If you have drug allergies check your prescription carefully.   You can use MyChart to ask questions about today's visit, request a non-urgent call back, or ask for a work or school excuse for 24 hours related to this e-Visit. If it has been greater than 24 hours you will need to follow up with your provider, or enter a new e-Visit to address those concerns. You will get an e-mail in the next two days asking about your experience.  I hope that your e-visit has been valuable and will speed your recovery. Thank you for using e-visits.  5 minutes spent reviewing and documenting in chart.

## 2018-08-08 DIAGNOSIS — J069 Acute upper respiratory infection, unspecified: Secondary | ICD-10-CM | POA: Diagnosis not present

## 2018-08-08 DIAGNOSIS — J029 Acute pharyngitis, unspecified: Secondary | ICD-10-CM | POA: Diagnosis not present

## 2018-08-11 ENCOUNTER — Telehealth: Payer: 59 | Admitting: Family

## 2018-08-11 DIAGNOSIS — J019 Acute sinusitis, unspecified: Secondary | ICD-10-CM

## 2018-08-11 MED ORDER — AMOXICILLIN-POT CLAVULANATE 875-125 MG PO TABS: 1.0000 | ORAL_TABLET | Freq: Two times a day (BID) | ORAL | 0 refills | Status: DC

## 2018-08-11 MED ORDER — FLUCONAZOLE 150 MG PO TABS: 150.0000 mg | ORAL_TABLET | Freq: Once | ORAL | 0 refills | Status: AC

## 2018-08-11 NOTE — Progress Notes (Signed)
We are sorry that you are not feeling well.  Here is how we plan to help!  Based on what you have shared with me it looks like you have sinusitis.  Sinusitis is inflammation and infection in the sinus cavities of the head.  Based on your presentation I believe you most likely have Acute Bacterial Sinusitis.  This is an infection caused by bacteria and is treated with antibiotics. I have prescribed Augmentin 875mg /125mg  one tablet twice daily with food, for 7 days. You may use an oral decongestant such as Mucinex D or if you have glaucoma or high blood pressure use plain Mucinex. Saline nasal spray help and can safely be used as often as needed for congestion.  If you develop worsening sinus pain, fever or notice severe headache and vision changes, or if symptoms are not better after completion of antibiotic, please schedule an appointment with a health care provider.     Approximately five minutes spent in patient's chart review and you're documenting.  Sinus infections are not as easily transmitted as other respiratory infection, however we still recommend that you avoid close contact with loved ones, especially the very young and elderly.  Remember to wash your hands thoroughly throughout the day as this is the number one way to prevent the spread of infection!  Home Care:  Only take medications as instructed by your medical team.  Complete the entire course of an antibiotic.  Do not take these medications with alcohol.  A steam or ultrasonic humidifier can help congestion.  You can place a towel over your head and breathe in the steam from hot water coming from a faucet.  Avoid close contacts especially the very young and the elderly.  Cover your mouth when you cough or sneeze.  Always remember to wash your hands.  Get Help Right Away If:  You develop worsening fever or sinus pain.  You develop a severe head ache or visual changes.  Your symptoms persist after you have completed  your treatment plan.  Make sure you  Understand these instructions.  Will watch your condition.  Will get help right away if you are not doing well or get worse.  Your e-visit answers were reviewed by a board certified advanced clinical practitioner to complete your personal care plan.  Depending on the condition, your plan could have included both over the counter or prescription medications.  If there is a problem please reply  once you have received a response from your provider.  Your safety is important to [place].  If you have drug allergies check your prescription carefully.    You can use MyChart to ask questions about today's visit, request a non-urgent call back, or ask for a work or school excuse for 24 hours related to this e-Visit. If it has been greater than 24 hours you will need to follow up with your provider, or enter a new e-Visit to address those concerns.  You will get an e-mail in the next two days asking about your experience.  I hope that your e-visit has been valuable and will speed your recovery. Thank you for using e-visits.

## 2018-08-11 NOTE — Addendum Note (Signed)
Addended by: Evelina Dun A on: 08/11/2018 06:36 PM   Modules accepted: Orders

## 2018-08-19 ENCOUNTER — Telehealth: Payer: 59 | Admitting: Nurse Practitioner

## 2018-08-19 DIAGNOSIS — B373 Candidiasis of vulva and vagina: Secondary | ICD-10-CM | POA: Diagnosis not present

## 2018-08-19 DIAGNOSIS — B3731 Acute candidiasis of vulva and vagina: Secondary | ICD-10-CM

## 2018-08-19 MED ORDER — FLUCONAZOLE 150 MG PO TABS: 150.0000 mg | ORAL_TABLET | Freq: Once | ORAL | 0 refills | Status: AC

## 2018-08-19 NOTE — Progress Notes (Signed)
We are sorry that you are not feeling well. Here is how we plan to help! Based on what you shared with me it looks like you: May have a yeast vaginosis  Vaginosis is an inflammation of the vagina that can result in discharge, itching and pain. The cause is usually a change in the normal balance of vaginal bacteria or an infection. Vaginosis can also result from reduced estrogen levels after menopause.  The most common causes of vaginosis are:   Bacterial vaginosis which results from an overgrowth of one on several organisms that are normally present in your vagina.   Yeast infections which are caused by a naturally occurring fungus called candida.   Vaginal atrophy (atrophic vaginosis) which results from the thinning of the vagina from reduced estrogen levels after menopause.   Trichomoniasis which is caused by a parasite and is commonly transmitted by sexual intercourse.  Factors that increase your risk of developing vaginosis include: . Medications, such as antibiotics and steroids . Uncontrolled diabetes . Use of hygiene products such as bubble bath, vaginal spray or vaginal deodorant . Douching . Wearing damp or tight-fitting clothing . Using an intrauterine device (IUD) for birth control . Hormonal changes, such as those associated with pregnancy, birth control pills or menopause . Sexual activity . Having a sexually transmitted infection  Your treatment plan is A single Diflucan (fluconazole) 150mg tablet once.  I have electronically sent this prescription into the pharmacy that you have chosen.  Be sure to take all of the medication as directed. Stop taking any medication if you develop a rash, tongue swelling or shortness of breath. Mothers who are breast feeding should consider pumping and discarding their breast milk while on these antibiotics. However, there is no consensus that infant exposure at these doses would be harmful.  Remember that medication creams can weaken latex  condoms. .   HOME CARE:  Good hygiene may prevent some types of vaginosis from recurring and may relieve some symptoms:  . Avoid baths, hot tubs and whirlpool spas. Rinse soap from your outer genital area after a shower, and dry the area well to prevent irritation. Don't use scented or harsh soaps, such as those with deodorant or antibacterial action. . Avoid irritants. These include scented tampons and pads. . Wipe from front to back after using the toilet. Doing so avoids spreading fecal bacteria to your vagina.  Other things that may help prevent vaginosis include:  . Don't douche. Your vagina doesn't require cleansing other than normal bathing. Repetitive douching disrupts the normal organisms that reside in the vagina and can actually increase your risk of vaginal infection. Douching won't clear up a vaginal infection. . Use a latex condom. Both female and female latex condoms may help you avoid infections spread by sexual contact. . Wear cotton underwear. Also wear pantyhose with a cotton crotch. If you feel comfortable without it, skip wearing underwear to bed. Yeast thrives in moist environments Your symptoms should improve in the next day or two.  GET HELP RIGHT AWAY IF:  . You have pain in your lower abdomen ( pelvic area or over your ovaries) . You develop nausea or vomiting . You develop a fever . Your discharge changes or worsens . You have persistent pain with intercourse . You develop shortness of breath, a rapid pulse, or you faint.  These symptoms could be signs of problems or infections that need to be evaluated by a medical provider now.  MAKE SURE YOU      Understand these instructions.  Will watch your condition.  Will get help right away if you are not doing well or get worse.  Your e-visit answers were reviewed by a board certified advanced clinical practitioner to complete your personal care plan. Depending upon the condition, your plan could have included  both over the counter or prescription medications. Please review your pharmacy choice to make sure that you have choses a pharmacy that is open for you to pick up any needed prescription, Your safety is important to us. If you have drug allergies check your prescription carefully.   You can use MyChart to ask questions about today's visit, request a non-urgent call back, or ask for a work or school excuse for 24 hours related to this e-Visit. If it has been greater than 24 hours you will need to follow up with your provider, or enter a new e-Visit to address those concerns. You will get a MyChart message within the next two days asking about your experience. I hope that your e-visit has been valuable and will speed your recovery.  5 minutes spent reviewing and documenting in chart.  

## 2018-08-22 ENCOUNTER — Telehealth: Payer: 59 | Admitting: Physician Assistant

## 2018-08-22 DIAGNOSIS — L290 Pruritus ani: Secondary | ICD-10-CM | POA: Diagnosis not present

## 2018-08-22 DIAGNOSIS — R198 Other specified symptoms and signs involving the digestive system and abdomen: Secondary | ICD-10-CM

## 2018-08-22 DIAGNOSIS — N949 Unspecified condition associated with female genital organs and menstrual cycle: Secondary | ICD-10-CM | POA: Diagnosis not present

## 2018-08-22 DIAGNOSIS — N898 Other specified noninflammatory disorders of vagina: Secondary | ICD-10-CM

## 2018-08-22 NOTE — Progress Notes (Signed)
Based on what you shared with me, I feel your condition warrants further evaluation and I recommend that you be seen for a face to face office visit. Giving ongoing symptoms and anal involvement, you need a good physical examination and mucosal swabbing.    NOTE: If you entered your credit card information for this eVisit, you will not be charged. You may see a "hold" on your card for the $30 but that hold will drop off and you will not have a charge processed.  If you are having a true medical emergency please call 911.  If you need an urgent face to face visit, [place] has four urgent care centers for your convenience.  If you need care fast and have a high deductible or no insurance consider:   [email or web address] to reserve your spot online an avoid wait times  [facility] [address], [address] [place], [place] [address] 8 am to 8 pm Monday-Friday 10 am to 4 pm Saturday-Sunday *Across the street from [employer]  [place], [address] 8 am to 5 pm Monday-Friday * In the [facility] on the [facility]   The following sites will take your insurance:  . [facility] Health Urgent [place] a Provider at this Location  [address] [place], [place] [address] . 10 am to 8 pm Monday-Friday . 12 pm to 8 pm Saturday-Sunday   . [facility] Health Urgent Care at [place] a Provider at this Location  [place], [place], [place] [address] . 8 am to 8 pm Monday-Friday . 9 am to 6 pm Saturday . 11 am to 6 pm Sunday   . [facility] Health Urgent Care at [place] Get Driving Directions  [address] Suite [place],  [address] . 8 am to 8 pm Monday-Friday . 8 am to 4 pm Saturday-Sunday   Your e-visit answers were reviewed by a board certified advanced clinical  practitioner to complete your personal care plan.  Thank you for using e-Visits.

## 2018-09-15 ENCOUNTER — Encounter: Payer: Self-pay | Admitting: Family Medicine

## 2018-10-21 ENCOUNTER — Encounter: Payer: Self-pay | Admitting: Physician Assistant

## 2018-10-21 ENCOUNTER — Telehealth: Payer: 59 | Admitting: Physician Assistant

## 2018-10-21 DIAGNOSIS — B373 Candidiasis of vulva and vagina: Secondary | ICD-10-CM | POA: Diagnosis not present

## 2018-10-21 DIAGNOSIS — B3731 Acute candidiasis of vulva and vagina: Secondary | ICD-10-CM

## 2018-10-21 MED ORDER — FLUCONAZOLE 150 MG PO TABS: 150.0000 mg | ORAL_TABLET | Freq: Once | ORAL | 0 refills | Status: AC

## 2018-10-21 NOTE — Progress Notes (Signed)
We are sorry that you are not feeling well. Here is how we plan to help! Based on what you shared with me it looks like you: May have a yeast vaginosis  Vaginosis is an inflammation of the vagina that can result in discharge, itching and pain. The cause is usually a change in the normal balance of vaginal bacteria or an infection. Vaginosis can also result from reduced estrogen levels after menopause.  The most common causes of vaginosis are:   Bacterial vaginosis which results from an overgrowth of one on several organisms that are normally present in your vagina.   Yeast infections which are caused by a naturally occurring fungus called candida.   Vaginal atrophy (atrophic vaginosis) which results from the thinning of the vagina from reduced estrogen levels after menopause.   Trichomoniasis which is caused by a parasite and is commonly transmitted by sexual intercourse.  Factors that increase your risk of developing vaginosis include: Marland Kitchen Medications, such as antibiotics and steroids . Uncontrolled diabetes . Use of hygiene products such as bubble bath, vaginal spray or vaginal deodorant . Douching . Wearing damp or tight-fitting clothing . Using an intrauterine device (IUD) for birth control . Hormonal changes, such as those associated with pregnancy, birth control pills or menopause . Sexual activity . Having a sexually transmitted infection  Your treatment plan is A single Diflucan (fluconazole) 150mg  tablet once.  I have electronically sent this prescription into the pharmacy that you have chosen.   Ms. Barbier,  Classically, yeast infection causes discharge and itching. You have declined vaginal discharge. If vaginal itching does not improve with the prescribed medication, if you develop any new symptoms including vaginal rash, please have a face to face evaluation of your symptoms. Also, It is not recommended that you prescribed antibiotics for longer than indicated.   Be sure to  take all of the medication as directed. Stop taking any medication if you develop a rash, tongue swelling or shortness of breath. Mothers who are breast feeding should consider pumping and discarding their breast milk while on these antibiotics. However, there is no consensus that infant exposure at these doses would be harmful.  Remember that medication creams can weaken latex condoms. Marland Kitchen   HOME CARE:  Good hygiene may prevent some types of vaginosis from recurring and may relieve some symptoms:  . Avoid baths, hot tubs and whirlpool spas. Rinse soap from your outer genital area after a shower, and dry the area well to prevent irritation. Don't use scented or harsh soaps, such as those with deodorant or antibacterial action. Marland Kitchen Avoid irritants. These include scented tampons and pads. . Wipe from front to back after using the toilet. Doing so avoids spreading fecal bacteria to your vagina.  Other things that may help prevent vaginosis include:  Marland Kitchen Don't douche. Your vagina doesn't require cleansing other than normal bathing. Repetitive douching disrupts the normal organisms that reside in the vagina and can actually increase your risk of vaginal infection. Douching won't clear up a vaginal infection. . Use a latex condom. Both female and female latex condoms may help you avoid infections spread by sexual contact. . Wear cotton underwear. Also wear pantyhose with a cotton crotch. If you feel comfortable without it, skip wearing underwear to bed. Yeast thrives in [employer] Your symptoms should improve in the next day or two.  GET HELP RIGHT AWAY IF:  . You have pain in your lower abdomen ( pelvic area or over your ovaries) . You  develop nausea or vomiting . You develop a fever . Your discharge changes or worsens . You have persistent pain with intercourse . You develop shortness of breath, a rapid pulse, or you faint.  These symptoms could be signs of problems or infections that need  to be evaluated by a medical provider now.  MAKE SURE YOU    Understand these instructions.  Will watch your condition.  Will get help right away if you are not doing well or get worse.  Your e-visit answers were reviewed by a board certified advanced clinical practitioner to complete your personal care plan. Depending upon the condition, your plan could have included both over the counter or prescription medications. Please review your pharmacy choice to make sure that you have choses a pharmacy that is open for you to pick up any needed prescription, Your safety is important to [place]. If you have drug allergies check your prescription carefully.   You can use MyChart to ask questions about today's visit, request a non-urgent call back, or ask for a work or school excuse for 24 hours related to this e-Visit. If it has been greater than 24 hours you will need to follow up with your provider, or enter a new e-Visit to address those concerns. You will get a MyChart message within the next two days asking about your experience. I hope that your e-visit has been valuable and will speed your recovery. I have spent 7 min in completion and review of this note- Lacy Duverney [facility]

## 2018-11-19 DIAGNOSIS — R350 Frequency of micturition: Secondary | ICD-10-CM | POA: Diagnosis not present

## 2018-11-25 DIAGNOSIS — Z1159 Encounter for screening for other viral diseases: Secondary | ICD-10-CM | POA: Diagnosis not present

## 2019-01-03 ENCOUNTER — Encounter: Payer: Self-pay | Admitting: Physician Assistant

## 2019-01-03 ENCOUNTER — Telehealth (INDEPENDENT_AMBULATORY_CARE_PROVIDER_SITE_OTHER): Payer: 59 | Admitting: Physician Assistant

## 2019-01-03 DIAGNOSIS — R110 Nausea: Secondary | ICD-10-CM | POA: Insufficient documentation

## 2019-01-03 DIAGNOSIS — M62838 Other muscle spasm: Secondary | ICD-10-CM

## 2019-01-03 DIAGNOSIS — G43009 Migraine without aura, not intractable, without status migrainosus: Secondary | ICD-10-CM | POA: Diagnosis not present

## 2019-01-03 MED ORDER — IBUPROFEN 800 MG PO TABS: 800.0000 mg | ORAL_TABLET | Freq: Three times a day (TID) | ORAL | 4 refills | Status: AC | PRN

## 2019-01-03 MED ORDER — ONDANSETRON HCL 4 MG PO TABS: 4.0000 mg | ORAL_TABLET | Freq: Three times a day (TID) | ORAL | 1 refills | Status: DC | PRN

## 2019-01-03 MED ORDER — BACLOFEN 10 MG PO TABS: 10.0000 mg | ORAL_TABLET | Freq: Three times a day (TID) | ORAL | 6 refills | Status: DC

## 2019-01-03 NOTE — Patient Instructions (Signed)

## 2019-01-03 NOTE — Progress Notes (Signed)
   TELEHEALTH VIRTUAL HEADACHE VISIT ENCOUNTER NOTE  I connected with Danielle Hickman on 01/03/19 at  8:00 AM EDT by telephone at home and verified that I am speaking with the correct person using two identifiers.   I discussed the limitations, risks, security and privacy concerns of performing an evaluation and management service by telephone and the availability of in person appointments. I also discussed with the patient that there may be a patient responsible charge related to this service. The patient expressed understanding and agreed to proceed.   History:  Danielle Hickman is a 23 y.o. G0P0000 female being evaluated today for yearly headache eval.  She notes the usual migraine medicines don't work for her.  Ibuprofen and baclofen are most helpful.  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    Medicines tried include Imitrex (helped with HA but caused side effects).  She also reports Sumatriptan (generic for Imitrex).  She has tried no other triptans and has not used preventive therapy.  She is not interested in trying new medications at this time.    She has 2 severe migraines per month and many more "regular" headaches including an increase around her period.     She is living in [place] where she attends college and is majoring in [employer] and gender studies.  She plans to purse sexual health for a career.   Past Medical History:  Diagnosis Date  . Allergy   . Depression   . Eczema    History reviewed. No pertinent surgical history. The following portions of the patient's history were reviewed and updated as appropriate: allergies, current medications, past family history, past medical history, past social history, past surgical history and problem list.   Review of Systems:  Pertinent items noted in HPI and remainder of comprehensive ROS otherwise negative.  Physical Exam:   General:  Alert, oriented and cooperative.   Mental Status: Normal mood and affect perceived.  Normal judgment and thought content.  Physical exam deferred due to nature of the encounter   Assessment and Plan:     1. Migraine without aura and without status migrainosus, not intractable   2. Nausea   3. Muscle spasm    Given rx for baclofen, ibuprofen and ondansetron per her request.  Other options discussed including Nurtek which would likely alleviate her migraine symptoms more completely, without side effects.  However she declines.    I discussed the assessment and treatment plan with the patient. The patient was provided an opportunity to ask questions and all were answered. The patient agreed with the plan and demonstrated an understanding of the instructions.   The patient was advised to call back or seek an in-person evaluation/go to the ED if the symptoms worsen or if the condition fails to improve as anticipated.  I provided 15 minutes of video-face-to-face time during this encounter.   Paticia Stack, PA-C [facility], [place]

## 2019-01-03 NOTE — Progress Notes (Signed)
I connected with  Danielle Hickman on 01/03/19 at  8:00 AM EDT by telephone and verified that I am speaking with the correct person using two identifiers.   I discussed the limitations, risks, security and privacy concerns of performing an evaluation and management service by telephone and the availability of in person appointments. I also discussed with the patient that there may be a patient responsible charge related to this service. The patient expressed understanding and agreed to proceed.  Crosby Oyster, RN 01/03/2019  8:14 AM

## 2019-01-05 DIAGNOSIS — Z0184 Encounter for antibody response examination: Secondary | ICD-10-CM | POA: Diagnosis not present

## 2019-01-05 DIAGNOSIS — Z20828 Contact with and (suspected) exposure to other viral communicable diseases: Secondary | ICD-10-CM | POA: Diagnosis not present

## 2019-01-17 DIAGNOSIS — Z20828 Contact with and (suspected) exposure to other viral communicable diseases: Secondary | ICD-10-CM | POA: Diagnosis not present

## 2019-02-21 DIAGNOSIS — R51 Headache: Secondary | ICD-10-CM | POA: Diagnosis not present

## 2019-02-21 DIAGNOSIS — H5202 Hypermetropia, left eye: Secondary | ICD-10-CM | POA: Diagnosis not present

## 2019-02-21 DIAGNOSIS — H52221 Regular astigmatism, right eye: Secondary | ICD-10-CM | POA: Diagnosis not present

## 2019-02-21 DIAGNOSIS — H524 Presbyopia: Secondary | ICD-10-CM | POA: Diagnosis not present

## 2019-02-24 DIAGNOSIS — F418 Other specified anxiety disorders: Secondary | ICD-10-CM | POA: Diagnosis not present

## 2019-02-24 DIAGNOSIS — Z23 Encounter for immunization: Secondary | ICD-10-CM | POA: Diagnosis not present

## 2019-02-24 DIAGNOSIS — Z01419 Encounter for gynecological examination (general) (routine) without abnormal findings: Secondary | ICD-10-CM | POA: Diagnosis not present

## 2019-02-27 ENCOUNTER — Encounter: Payer: Self-pay | Admitting: Radiology

## 2019-03-16 DIAGNOSIS — Z20828 Contact with and (suspected) exposure to other viral communicable diseases: Secondary | ICD-10-CM | POA: Diagnosis not present

## 2019-03-17 DIAGNOSIS — F418 Other specified anxiety disorders: Secondary | ICD-10-CM | POA: Diagnosis not present

## 2019-03-24 ENCOUNTER — Telehealth: Payer: 59 | Admitting: Family

## 2019-03-24 DIAGNOSIS — R21 Rash and other nonspecific skin eruption: Secondary | ICD-10-CM | POA: Diagnosis not present

## 2019-03-24 MED ORDER — PREDNISONE 10 MG (21) PO TBPK: ORAL_TABLET | ORAL | 0 refills | Status: DC

## 2019-03-24 NOTE — Progress Notes (Signed)
E Visit for Rash  We are sorry that you are not feeling well. Here is how we plan to help!  Based on what you shared with me it looks like you have contact dermatitis.  Contact dermatitis is a skin rash caused by something that touches the skin and causes irritation or inflammation.  Your skin may be red, swollen, dry, cracked, and itch.  The rash should go away in a few days but can last a few weeks.  If you get a rash, it's important to figure out what caused it so the irritant can be avoided in the future.   Prednisone 10 mg daily for 6 days (see taper instructions below)  Directions for 6 day taper: Day 1: 2 tablets before breakfast, 1 after both lunch & dinner and 2 at bedtime Day 2: 1 tab before breakfast, 1 after both lunch & dinner and 2 at bedtime Day 3: 1 tab at each meal & 1 at bedtime Day 4: 1 tab at breakfast, 1 at lunch, 1 at bedtime Day 5: 1 tab at breakfast & 1 tab at bedtime Day 6: 1 tab at breakfast   As discussed on the phone, we will treat with prednisone. Let us know if your rash worsens or does not improve. Keep clean and dry. Avoid scratching. Approximately 5 minutes was spent documenting and reviewing patient's chart.    HOME CARE:   Take cool showers and avoid direct sunlight.  Apply cool compress or wet dressings.  Take a bath in an oatmeal bath.  Sprinkle content of one Aveeno packet under running faucet with comfortably warm water.  Bathe for 15-20 minutes, 1-2 times daily.  Pat dry with a towel. Do not rub the rash.  Use hydrocortisone cream.  Take an antihistamine like Benadryl for widespread rashes that itch.  The adult dose of Benadryl is 25-50 mg by mouth 4 times daily.  Caution:  This type of medication may cause sleepiness.  Do not drink alcohol, drive, or operate dangerous machinery while taking antihistamines.  Do not take these medications if you have prostate enlargement.  Read package instructions thoroughly on all medications that you  take.  GET HELP RIGHT AWAY IF:   Symptoms don't go away after treatment.  Severe itching that persists.  If you rash spreads or swells.  If you rash begins to smell.  If it blisters and opens or develops a yellow-brown crust.  You develop a fever.  You have a sore throat.  You become short of breath.  MAKE SURE YOU:  Understand these instructions. Will watch your condition. Will get help right away if you are not doing well or get worse.  Thank you for choosing an e-visit. Your e-visit answers were reviewed by a board certified advanced clinical practitioner to complete your personal care plan. Depending upon the condition, your plan could have included both over the counter or prescription medications. Please review your pharmacy choice. Be sure that the pharmacy you have chosen is open so that you can pick up your prescription now.  If there is a problem you may message your provider in [place] to have the prescription routed to another pharmacy. Your safety is important to [place]. If you have drug allergies check your prescription carefully.  For the next 24 hours, you can use MyChart to ask questions about today's visit, request a non-urgent call back, or ask for a work or school excuse from your e-visit provider. You will get an email in the  next two days asking about your experience. I hope that your e-visit has been valuable and will speed your recovery.

## 2019-03-25 MED FILL — predniSONE 10 MG (21) TBPK: 10 | 6 days supply | Qty: 21 | Fill #0

## 2019-04-04 MED FILL — ONDANSETRON HCL 4 MG TABLET: 4 | 10 days supply | Qty: 30 | Fill #0

## 2019-04-04 MED FILL — BACLOFEN 10 MG TABS: 10 | 13 days supply | Qty: 40 | Fill #0

## 2019-04-11 ENCOUNTER — Encounter: Payer: Self-pay | Admitting: Family Medicine

## 2019-04-11 ENCOUNTER — Other Ambulatory Visit: Payer: Self-pay

## 2019-04-11 ENCOUNTER — Ambulatory Visit (INDEPENDENT_AMBULATORY_CARE_PROVIDER_SITE_OTHER): Payer: 59 | Admitting: Family Medicine

## 2019-04-11 DIAGNOSIS — R350 Frequency of micturition: Secondary | ICD-10-CM | POA: Insufficient documentation

## 2019-04-11 DIAGNOSIS — F330 Major depressive disorder, recurrent, mild: Secondary | ICD-10-CM | POA: Diagnosis not present

## 2019-04-11 DIAGNOSIS — Z3041 Encounter for surveillance of contraceptive pills: Secondary | ICD-10-CM

## 2019-04-11 DIAGNOSIS — N760 Acute vaginitis: Secondary | ICD-10-CM | POA: Diagnosis not present

## 2019-04-11 DIAGNOSIS — F411 Generalized anxiety disorder: Secondary | ICD-10-CM | POA: Diagnosis not present

## 2019-04-11 NOTE — Progress Notes (Signed)
Virtual Visit via Video   I connected with patient on 04/11/19 at  9:30 AM EDT by a video enabled telemedicine application and verified that I am speaking with the correct person using two identifiers.  Location patient: Home Location provider: Acupuncturist, Office Persons participating in the virtual visit: Patient, Provider, [place] (Jess B)  I discussed the limitations of evaluation and management by telemedicine and the availability of in person appointments. The patient expressed understanding and agreed to proceed.  Subjective:   HPI:   OCPs- goes to school in [place] but came home for the semester.  Has doctor in [place] who prescribes OCPs but needs refills now that she's home.  Birth control is effective, no changes needed  Vaginitis- pt took a bath 2 days ago and subsequently developed itching, scant d/c, dryness.  Hx of similar- responded to Diflucan.  Pt is asking for tx.  Increased urination- started 6 months ago.  Had evaluation for UTI but that was negative.  Increased water intake.  'significantly different'  ROS:   See pertinent positives and negatives per HPI.  Patient Active Problem List   Diagnosis Date Noted  . Nausea 01/03/2019  . Migraine without aura and without status migrainosus, not intractable 01/03/2019  . Food allergy 07/11/2017  . 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 Seasonal and perennial allergic rhinitis 07/11/2017  . Allergic conjunctivitis 07/11/2017  . Oral contraceptive use 07/10/2017  . Muscle spasm 01/28/2016  . Physical exam 09/16/2015  . Vulval candidiasis 02/16/2015  . Vaginitis and vulvovaginitis 02/16/2015  . Acute tonsillitis 01/12/2014  . Dysmenorrhea 09/22/2013  . Inattention 07/23/2013  . URI (upper respiratory infection) 10/28/2012  . Depression 08/08/2011    Social History   Tobacco Use  . Smoking status: Never Smoker  . Smokeless tobacco: Never Used  Substance Use Topics  . Alcohol use: No    Current  Outpatient Medications:  .  baclofen (LIORESAL) 10 MG tablet, Take 1 tablet (10 mg total) by mouth 3 (three) times daily., Disp: 40 each, Rfl: 6 .  desogestrel-ethinyl estradiol (APRI) 0.15-30 MG-MCG tablet, Take 1 tablet by mouth daily., Disp: , Rfl:  .  EPINEPHrine (AUVI-Q) 0.3 mg/0.3 mL IJ SOAJ injection, Use as directed for severe allergic reactions, Disp: 4 Device, Rfl: 3 .  fluticasone (FLONASE) 50 MCG/ACT nasal spray, 1 spray 1-2 times a day as needed, Disp: 16 g, Rfl: 5 .  ibuprofen (ADVIL) 800 MG tablet, Take 1 tablet (800 mg total) by mouth every 8 (eight) hours as needed., Disp: 60 tablet, Rfl: 4 .  levocetirizine (XYZAL) 5 MG tablet, Take 1 tablet (5 mg total) by mouth every evening. As needed, Disp: 30 tablet, Rfl: 5 .  ondansetron (ZOFRAN) 4 MG tablet, Take 1 tablet (4 mg total) by mouth every 8 (eight) hours as needed for nausea or vomiting., Disp: 30 tablet, Rfl: 1  Allergies  Allergen Reactions  . Apple Itching and Swelling  . [place]   . Kiwi Extract Hives, Itching and Swelling  . Other Hives, Itching and Swelling    Allergic to walnut and almond  . Peach [Prunus Persica] Hives, Itching and Swelling  . Pear   . Plum Pulp Itching and Swelling    Objective:   There were no vitals taken for this visit. AAOx3, NAD NCAT, EOMI No obvious CN deficits Coloring WNL Pt is able to speak clearly, coherently without shortness of breath or increased work of breathing.  Thought process is linear.  Mood is appropriate.  Assessment and Plan:   Birth control- will provide local refills while pt is home from [place].  No changes need to be made.  Vaginitis- pt reports this is similar to previous yeast infections and is asking for diflucan.  If no improvement after 1 dose of medication will need appt for evaluation.  Urinary frequency- new.  Suspect this is directly related to her increased water intake since she is not having UTI sxs.  Encouraged her to log both her intake and  the number of times she uses the restroom daily.  Pt expressed understanding and is in agreement w/ plan.    Annye Asa, MD 04/11/2019

## 2019-04-11 NOTE — Progress Notes (Signed)
I have discussed the procedure for the virtual visit with the patient who has given consent to proceed with assessment and treatment.   Jessica L Brodmerkel, CMA     

## 2019-04-14 ENCOUNTER — Telehealth: Payer: Self-pay | Admitting: Family Medicine

## 2019-04-14 MED ORDER — DESOGESTREL-ETHINYL ESTRADIOL 0.15-30 MG-MCG PO TABS: 1.0000 | ORAL_TABLET | Freq: Every day | ORAL | 1 refills | Status: DC

## 2019-04-14 MED FILL — APRI 0.15MG/0.03MG 28 DAY: 0.15-30 | 84 days supply | Qty: 84 | Fill #0

## 2019-04-14 NOTE — Addendum Note (Signed)
Addended by: Davis Gourd on: 04/14/2019 09:07 AM   Modules accepted: Orders

## 2019-04-14 NOTE — Telephone Encounter (Signed)
Patient called in and stated that her [facility] she picked up was the wrong one she would like    desogestrel-ethinyl estradiol (APRI) 0.15-30 MG-MCG tablet  Sent in to  [place], [place] - [address] [phone] (Phone) [phone] (Fax)

## 2019-04-14 NOTE — Telephone Encounter (Signed)
Medication filled to pharmacy as requested.   

## 2019-04-18 DIAGNOSIS — F330 Major depressive disorder, recurrent, mild: Secondary | ICD-10-CM | POA: Diagnosis not present

## 2019-04-18 DIAGNOSIS — F411 Generalized anxiety disorder: Secondary | ICD-10-CM | POA: Diagnosis not present

## 2019-04-20 DIAGNOSIS — Z20828 Contact with and (suspected) exposure to other viral communicable diseases: Secondary | ICD-10-CM | POA: Diagnosis not present

## 2019-04-25 DIAGNOSIS — F411 Generalized anxiety disorder: Secondary | ICD-10-CM | POA: Diagnosis not present

## 2019-04-25 DIAGNOSIS — F330 Major depressive disorder, recurrent, mild: Secondary | ICD-10-CM | POA: Diagnosis not present

## 2019-05-02 DIAGNOSIS — F411 Generalized anxiety disorder: Secondary | ICD-10-CM | POA: Diagnosis not present

## 2019-05-02 DIAGNOSIS — F330 Major depressive disorder, recurrent, mild: Secondary | ICD-10-CM | POA: Diagnosis not present

## 2019-05-09 DIAGNOSIS — F330 Major depressive disorder, recurrent, mild: Secondary | ICD-10-CM | POA: Diagnosis not present

## 2019-05-09 DIAGNOSIS — F411 Generalized anxiety disorder: Secondary | ICD-10-CM | POA: Diagnosis not present

## 2019-05-10 DIAGNOSIS — Z20828 Contact with and (suspected) exposure to other viral communicable diseases: Secondary | ICD-10-CM | POA: Diagnosis not present

## 2019-05-10 DIAGNOSIS — Z7189 Other specified counseling: Secondary | ICD-10-CM | POA: Diagnosis not present

## 2019-05-13 DIAGNOSIS — Z20828 Contact with and (suspected) exposure to other viral communicable diseases: Secondary | ICD-10-CM | POA: Diagnosis not present

## 2019-05-30 DIAGNOSIS — F330 Major depressive disorder, recurrent, mild: Secondary | ICD-10-CM | POA: Diagnosis not present

## 2019-05-30 DIAGNOSIS — F411 Generalized anxiety disorder: Secondary | ICD-10-CM | POA: Diagnosis not present

## 2019-06-06 DIAGNOSIS — F411 Generalized anxiety disorder: Secondary | ICD-10-CM | POA: Diagnosis not present

## 2019-06-06 DIAGNOSIS — F330 Major depressive disorder, recurrent, mild: Secondary | ICD-10-CM | POA: Diagnosis not present

## 2019-06-13 DIAGNOSIS — F330 Major depressive disorder, recurrent, mild: Secondary | ICD-10-CM | POA: Diagnosis not present

## 2019-06-13 DIAGNOSIS — F411 Generalized anxiety disorder: Secondary | ICD-10-CM | POA: Diagnosis not present

## 2019-06-19 DIAGNOSIS — F330 Major depressive disorder, recurrent, mild: Secondary | ICD-10-CM | POA: Diagnosis not present

## 2019-06-19 DIAGNOSIS — F411 Generalized anxiety disorder: Secondary | ICD-10-CM | POA: Diagnosis not present

## 2019-06-26 ENCOUNTER — Ambulatory Visit: Payer: 59 | Attending: Internal Medicine

## 2019-06-26 DIAGNOSIS — F330 Major depressive disorder, recurrent, mild: Secondary | ICD-10-CM | POA: Diagnosis not present

## 2019-06-26 DIAGNOSIS — Z20822 Contact with and (suspected) exposure to covid-19: Secondary | ICD-10-CM

## 2019-06-26 DIAGNOSIS — F411 Generalized anxiety disorder: Secondary | ICD-10-CM | POA: Diagnosis not present

## 2019-06-26 DIAGNOSIS — Z20828 Contact with and (suspected) exposure to other viral communicable diseases: Secondary | ICD-10-CM | POA: Diagnosis not present

## 2019-06-28 LAB — NOVEL CORONAVIRUS, NAA: SARS-CoV-2, NAA: NOT DETECTED

## 2019-06-28 MED FILL — BACLOFEN 10 MG TABS: 10 | 13 days supply | Qty: 40 | Fill #1

## 2019-06-28 MED FILL — APRI 0.15MG/0.03MG 28 DAY: 0.15-30 | 84 days supply | Qty: 84 | Fill #1

## 2019-07-07 DIAGNOSIS — Z03818 Encounter for observation for suspected exposure to other biological agents ruled out: Secondary | ICD-10-CM | POA: Diagnosis not present

## 2019-07-19 DIAGNOSIS — Z20828 Contact with and (suspected) exposure to other viral communicable diseases: Secondary | ICD-10-CM | POA: Diagnosis not present

## 2019-07-25 DIAGNOSIS — F411 Generalized anxiety disorder: Secondary | ICD-10-CM | POA: Diagnosis not present

## 2019-07-25 DIAGNOSIS — F330 Major depressive disorder, recurrent, mild: Secondary | ICD-10-CM | POA: Diagnosis not present

## 2019-07-30 ENCOUNTER — Other Ambulatory Visit: Payer: Self-pay

## 2019-07-30 ENCOUNTER — Encounter: Payer: Self-pay | Admitting: Family Medicine

## 2019-07-30 ENCOUNTER — Ambulatory Visit (INDEPENDENT_AMBULATORY_CARE_PROVIDER_SITE_OTHER): Payer: 59 | Admitting: Family Medicine

## 2019-07-30 DIAGNOSIS — K5900 Constipation, unspecified: Secondary | ICD-10-CM

## 2019-07-30 DIAGNOSIS — K2900 Acute gastritis without bleeding: Secondary | ICD-10-CM | POA: Diagnosis not present

## 2019-07-30 DIAGNOSIS — Z3009 Encounter for other general counseling and advice on contraception: Secondary | ICD-10-CM | POA: Diagnosis not present

## 2019-07-30 MED ORDER — POLYETHYLENE GLYCOL 3350 17 GM/SCOOP PO POWD: 17.0000 g | Freq: Every day | ORAL | 1 refills | Status: DC

## 2019-07-30 MED ORDER — OMEPRAZOLE 20 MG PO CPDR: 20.0000 mg | DELAYED_RELEASE_CAPSULE | Freq: Every day | ORAL | 3 refills | Status: DC

## 2019-07-30 MED ORDER — SUCRALFATE 1 G PO TABS: 1.0000 g | ORAL_TABLET | Freq: Three times a day (TID) | ORAL | 0 refills | Status: DC

## 2019-07-30 MED FILL — SUCRALFATE 1 GM TABLET: 1 | 22 days supply | Qty: 90 | Fill #0

## 2019-07-30 MED FILL — OMEPRAZOLE 20 MG CAP: 20 | 30 days supply | Qty: 30 | Fill #0

## 2019-07-30 NOTE — Progress Notes (Signed)
I have discussed the procedure for the virtual visit with the patient who has given consent to proceed with assessment and treatment.   Pt unable to obtain vitals.   Terrica Duecker L Citlalic Norlander, CMA     

## 2019-07-30 NOTE — Progress Notes (Signed)
Virtual Visit via Video   I connected with patient on 07/30/19 at  3:00 PM EST by a video enabled telemedicine application and verified that I am speaking with the correct person using two identifiers.  Location patient: Home Location provider: Acupuncturist, Office Persons participating in the virtual visit: Patient, Provider, [place] (Jess B)  I discussed the limitations of evaluation and management by telemedicine and the availability of in person appointments. The patient expressed understanding and agreed to proceed.  Subjective:   HPI:  'my digestive system is going crazy'- Pt reports that during January she would get nauseous after eating, was having increased headaches, increased constipation.  Pt correlates constipation w/ meat and dairy intake.  Eliminated both meat and dairy.  Used an enema w/ good relief.  Did a 2nd one the next day.  Since 2nd enema has been able to have BMs- softer, coffee induced.  Has some residual nausea after eating- 'but not nearly as bad'.  Pt reports increased bloating and epigastric discomfort after eating.  + GERD.  Pt reports increased stress recently.  ROS:   See pertinent positives and negatives per HPI.  Patient Active Problem List   Diagnosis Date Noted  . Urinary frequency 04/11/2019  . Migraine without aura and without status migrainosus, not intractable 01/03/2019  . Food allergy 07/11/2017  . 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 Seasonal and perennial allergic rhinitis 07/11/2017  . Oral contraceptive use 07/10/2017  . Muscle spasm 01/28/2016  . Physical exam 09/16/2015  . Vulval candidiasis 02/16/2015  . Vaginitis and vulvovaginitis 02/16/2015  . Dysmenorrhea 09/22/2013  . Inattention 07/23/2013  . Depression 08/08/2011    Social History   Tobacco Use  . Smoking status: Never Smoker  . Smokeless tobacco: Never Used  Substance Use Topics  . Alcohol use: No    Current Outpatient Medications:  .  baclofen (LIORESAL) 10  MG tablet, Take 1 tablet (10 mg total) by mouth 3 (three) times daily., Disp: 40 each, Rfl: 6 .  desogestrel-ethinyl estradiol (APRI) 0.15-30 MG-MCG tablet, Take 1 tablet by mouth daily., Disp: 3 Package, Rfl: 1 .  fluticasone (FLONASE) 50 MCG/ACT nasal spray, 1 spray 1-2 times a day as needed, Disp: 16 g, Rfl: 5 .  ibuprofen (ADVIL) 800 MG tablet, Take 1 tablet (800 mg total) by mouth every 8 (eight) hours as needed., Disp: 60 tablet, Rfl: 4 .  levocetirizine (XYZAL) 5 MG tablet, Take 1 tablet (5 mg total) by mouth every evening. As needed, Disp: 30 tablet, Rfl: 5 .  ondansetron (ZOFRAN) 4 MG tablet, Take 1 tablet (4 mg total) by mouth every 8 (eight) hours as needed for nausea or vomiting., Disp: 30 tablet, Rfl: 1 .  EPINEPHrine (AUVI-Q) 0.3 mg/0.3 mL IJ SOAJ injection, Use as directed for severe allergic reactions (Patient not taking: Reported on 07/30/2019), Disp: 4 Device, Rfl: 3  Allergies  Allergen Reactions  . Apple Itching and Swelling  . [place]   . Kiwi Extract Hives, Itching and Swelling  . Other Hives, Itching and Swelling    Allergic to walnut and almond  . Peach [Prunus Persica] Hives, Itching and Swelling  . Pear   . Plum Pulp Itching and Swelling    Objective:   There were no vitals taken for this visit.  AAOx3, NAD NCAT, EOMI No obvious CN deficits Coloring WNL Pt is able to speak clearly, coherently without shortness of breath or increased work of breathing.  Thought process is linear.  Mood is appropriate.  Assessment and Plan:   Gastritis- new.  Reviewed dx w/ pt.  Discussed diet and lifestyle modifications.  Start PPI and carafate.  Monitor for sx improvement.  Pt expressed understanding and is in agreement w/ plan.   Constipation- new.  Pt did 2 home enemas w/ some relief.  Start daily Miralax to improve sxs.  Pt expressed understanding and is in agreement w/ plan.   Contraception management- pt is asking for referral to GYN for long acting contraception.   Referral placed.   Annye Asa, MD 07/30/2019

## 2019-08-01 DIAGNOSIS — F411 Generalized anxiety disorder: Secondary | ICD-10-CM | POA: Diagnosis not present

## 2019-08-01 DIAGNOSIS — F330 Major depressive disorder, recurrent, mild: Secondary | ICD-10-CM | POA: Diagnosis not present

## 2019-08-05 DIAGNOSIS — M26632 Articular disc disorder of left temporomandibular joint: Secondary | ICD-10-CM | POA: Diagnosis not present

## 2019-08-05 DIAGNOSIS — G4763 Sleep related bruxism: Secondary | ICD-10-CM | POA: Diagnosis not present

## 2019-08-05 DIAGNOSIS — G44209 Tension-type headache, unspecified, not intractable: Secondary | ICD-10-CM | POA: Diagnosis not present

## 2019-08-08 DIAGNOSIS — F330 Major depressive disorder, recurrent, mild: Secondary | ICD-10-CM | POA: Diagnosis not present

## 2019-08-08 DIAGNOSIS — F411 Generalized anxiety disorder: Secondary | ICD-10-CM | POA: Diagnosis not present

## 2019-08-19 ENCOUNTER — Ambulatory Visit (INDEPENDENT_AMBULATORY_CARE_PROVIDER_SITE_OTHER): Payer: 59 | Admitting: Obstetrics and Gynecology

## 2019-08-19 ENCOUNTER — Other Ambulatory Visit: Payer: Self-pay

## 2019-08-19 ENCOUNTER — Encounter: Payer: Self-pay | Admitting: Obstetrics and Gynecology

## 2019-08-19 VITALS — BP 116/74 | Ht 64.0 in | Wt 144.0 lb

## 2019-08-19 DIAGNOSIS — Z3009 Encounter for other general counseling and advice on contraception: Secondary | ICD-10-CM

## 2019-08-19 DIAGNOSIS — N943 Premenstrual tension syndrome: Secondary | ICD-10-CM | POA: Diagnosis not present

## 2019-08-19 DIAGNOSIS — M26632 Articular disc disorder of left temporomandibular joint: Secondary | ICD-10-CM | POA: Diagnosis not present

## 2019-08-19 DIAGNOSIS — G44209 Tension-type headache, unspecified, not intractable: Secondary | ICD-10-CM | POA: Diagnosis not present

## 2019-08-19 DIAGNOSIS — G4763 Sleep related bruxism: Secondary | ICD-10-CM | POA: Diagnosis not present

## 2019-08-19 NOTE — Progress Notes (Signed)
   CHIKAMSO ALMASY  07/16/1995 [id number]  HPI The patient is a 24 y.o. G0P0000 who presents today for contraception consultation.  She is currently taking birth control pills.  She would like to explore other options.  She has had cyclic headaches and moodiness surrounding the time of her period.  Her period has been normal on the birth control pill.  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  Past medical history,surgical history, problem list, medications, allergies, family history and social history were all reviewed and documented as reviewed in the EPIC chart.  ROS:  Feeling well. No dyspnea or chest pain on exertion.  No abdominal pain, change in bowel habits, black or bloody stools.  No urinary tract symptoms. GYN ROS: normal menses, no abnormal bleeding, pelvic pain or discharge.  Physical Exam  BP 116/74   Ht 5\' 4"  (1.626 m)   Wt 144 lb (65.3 kg)   LMP 07/31/2019   BMI 24.72 kg/m   General: Pleasant female, no acute distress, alert and oriented PELVIC EXAM: Deferred due to consultative nature of the visit  Assessment 24 year old G0 here for contraception consultation  Plan The patient is mainly interested in learning more about the [place] IUDs.  We discussed both hormonal and nonhormonal IUD options.  We discussed the Skyla versus the Mirena.  Skyla less likely to cause amenorrhea than Mirena but still may.  Premenstrual syndrome symptoms may be better controlled by continuous oral contraceptive pills than IUDs, but she could still try an IUD if she would like.  I counseled her on the Mirena IUD including in the initial period of time after insertion to expect cramping and/or abnormal bleeding, especially in the first 6 months use.  After this time period the period can get very light or some women become amenorrheic.  Insertion risks include infection, uterine perforation and device migration into the abdomen which would require surgical  removal, or the device can become dislodged/embedded causing pain or advice can fall out of the uterus unnoticed which would preclude effective contraception.  We discussed that IUD insertion can be quite uncomfortable but generally is tolerated fine.  At times it is more difficult in a nulligravid female but overall insertion still usually goes fine.    I would recommend abstinence until her next if deciding that she would like to have an IUD.  She would like to think about those options and will let me know if she has any further questions or concerns.   Joseph Pierini MD 08/19/19

## 2019-08-26 DIAGNOSIS — G44209 Tension-type headache, unspecified, not intractable: Secondary | ICD-10-CM | POA: Diagnosis not present

## 2019-08-26 DIAGNOSIS — M26632 Articular disc disorder of left temporomandibular joint: Secondary | ICD-10-CM | POA: Diagnosis not present

## 2019-08-26 DIAGNOSIS — G4763 Sleep related bruxism: Secondary | ICD-10-CM | POA: Diagnosis not present

## 2019-09-02 ENCOUNTER — Encounter: Payer: Self-pay | Admitting: Radiology

## 2019-09-12 ENCOUNTER — Encounter: Payer: Self-pay | Admitting: Family Medicine

## 2019-09-12 MED ORDER — DESOGESTREL-ETHINYL ESTRADIOL 0.15-30 MG-MCG PO TABS: 1.0000 | ORAL_TABLET | Freq: Every day | ORAL | 4 refills | Status: DC

## 2019-09-12 MED ORDER — OMEPRAZOLE 20 MG PO CPDR: 20.0000 mg | DELAYED_RELEASE_CAPSULE | Freq: Every day | ORAL | 3 refills | Status: DC

## 2019-09-19 DIAGNOSIS — F411 Generalized anxiety disorder: Secondary | ICD-10-CM | POA: Diagnosis not present

## 2019-09-19 DIAGNOSIS — F330 Major depressive disorder, recurrent, mild: Secondary | ICD-10-CM | POA: Diagnosis not present

## 2019-10-14 DIAGNOSIS — Z23 Encounter for immunization: Secondary | ICD-10-CM | POA: Diagnosis not present

## 2019-10-15 DIAGNOSIS — Z113 Encounter for screening for infections with a predominantly sexual mode of transmission: Secondary | ICD-10-CM | POA: Diagnosis not present

## 2019-10-15 DIAGNOSIS — Z3009 Encounter for other general counseling and advice on contraception: Secondary | ICD-10-CM | POA: Diagnosis not present

## 2019-10-22 DIAGNOSIS — R0981 Nasal congestion: Secondary | ICD-10-CM | POA: Diagnosis not present

## 2019-10-22 DIAGNOSIS — J029 Acute pharyngitis, unspecified: Secondary | ICD-10-CM | POA: Diagnosis not present

## 2019-10-22 DIAGNOSIS — Z20822 Contact with and (suspected) exposure to covid-19: Secondary | ICD-10-CM | POA: Diagnosis not present

## 2019-11-04 DIAGNOSIS — Z23 Encounter for immunization: Secondary | ICD-10-CM | POA: Diagnosis not present

## 2019-11-12 ENCOUNTER — Telehealth: Payer: 59 | Admitting: Family

## 2019-11-12 DIAGNOSIS — B3731 Acute candidiasis of vulva and vagina: Secondary | ICD-10-CM

## 2019-11-12 DIAGNOSIS — B373 Candidiasis of vulva and vagina: Secondary | ICD-10-CM | POA: Diagnosis not present

## 2019-11-12 MED ORDER — FLUCONAZOLE 150 MG PO TABS: 150.0000 mg | ORAL_TABLET | ORAL | 0 refills | Status: DC | PRN

## 2019-11-12 NOTE — Progress Notes (Signed)

## 2019-11-27 ENCOUNTER — Encounter: Payer: Self-pay | Admitting: Radiology

## 2019-11-27 ENCOUNTER — Telehealth: Payer: Self-pay | Admitting: Radiology

## 2019-11-27 NOTE — Telephone Encounter (Signed)
Left message for patient to call [id number] to schedule July appointment for headache f/u with Allie Dimmer

## 2019-12-03 DIAGNOSIS — R140 Abdominal distension (gaseous): Secondary | ICD-10-CM | POA: Diagnosis not present

## 2019-12-03 DIAGNOSIS — Z114 Encounter for screening for human immunodeficiency virus [HIV]: Secondary | ICD-10-CM | POA: Diagnosis not present

## 2019-12-03 DIAGNOSIS — R12 Heartburn: Secondary | ICD-10-CM | POA: Diagnosis not present

## 2019-12-15 ENCOUNTER — Telehealth: Payer: 59 | Admitting: Family

## 2019-12-15 DIAGNOSIS — N898 Other specified noninflammatory disorders of vagina: Secondary | ICD-10-CM

## 2019-12-15 DIAGNOSIS — R509 Fever, unspecified: Secondary | ICD-10-CM

## 2019-12-15 NOTE — Progress Notes (Signed)
Based on what you shared with me, I feel your condition warrants further evaluation and I recommend that you be seen for a face to face office visit.  Given you are having vaginal discharge and a fever you need to be seen face to face for further testing to rule out a more serious infection.    NOTE: If you entered your credit card information for this eVisit, you will not be charged. You may see a "hold" on your card for the $35 but that hold will drop off and you will not have a charge processed.   If you are having a true medical emergency please call 911.      For an urgent face to face visit, [facility] has five urgent care centers for your convenience:      NEW:  [facility] [place] [facility] [place] Get Driving Directions [phone] [address] [place], [place] [address] . 10 am - 6pm Monday - Friday    [place] [facility]) Get Driving Directions [phone] [address] [place], [place] [address] . 10 am to 8 pm Monday-Friday . 12 pm to 8 pm [facility] at [facility] Get Driving Directions [phone] [place], [place], [place] [address] . 8 am to 8 pm Monday-Friday . 9 am to 6 pm Saturday . 11 am to 6 pm Sunday     [facility] at [facility] Get Driving Directions  [phone] [address], [place] [address] . 8 am to 8 pm Monday-Friday . 8 am to 4 pm [facility] at [place] Get Driving Directions [phone] [address] [place], [place] [address] . 12 pm to 6 pm Monday-Friday      Your e-visit answers were reviewed by a board certified advanced clinical practitioner to complete your personal care plan.  Thank you for using e-Visits.

## 2019-12-16 DIAGNOSIS — Z20822 Contact with and (suspected) exposure to covid-19: Secondary | ICD-10-CM | POA: Diagnosis not present

## 2020-01-28 DIAGNOSIS — Z20822 Contact with and (suspected) exposure to covid-19: Secondary | ICD-10-CM | POA: Diagnosis not present

## 2020-05-06 DIAGNOSIS — L700 Acne vulgaris: Secondary | ICD-10-CM | POA: Diagnosis not present

## 2020-05-16 ENCOUNTER — Telehealth: Payer: 59 | Admitting: Family

## 2020-05-16 DIAGNOSIS — J029 Acute pharyngitis, unspecified: Secondary | ICD-10-CM

## 2020-05-16 MED ORDER — AMOXICILLIN 500 MG PO CAPS: 500.0000 mg | ORAL_CAPSULE | Freq: Two times a day (BID) | ORAL | 0 refills | Status: DC

## 2020-05-16 NOTE — Addendum Note (Signed)
Addended by: Evelina Dun A on: 05/16/2020 11:01 AM   Modules accepted: Orders

## 2020-05-16 NOTE — Progress Notes (Signed)
We are sorry that you are not feeling well.  Here is how we plan to help!  Based on what you have shared with me it is likely that you have strep pharyngitis.  Strep pharyngitis is inflammation and infection in the back of the throat.  This is an infection cause by bacteria and is treated with antibiotics.  I have prescribed Penicillin V 500 mg twice a day for 10 days. For throat pain, we recommend over the counter oral pain relief medications such as acetaminophen or aspirin, or anti-inflammatory medications such as ibuprofen or naproxen sodium. Topical treatments such as oral throat lozenges or sprays may be used as needed. Strep infections are not as easily transmitted as other respiratory infections, however we still recommend that you avoid close contact with loved ones, especially the very young and elderly.  Remember to wash your hands thoroughly throughout the day as this is the number one way to prevent the spread of infection and wipe down door knobs and counters with disinfectant.   Home Care:  Only take medications as instructed by your medical team.  Complete the entire course of an antibiotic.  Do not take these medications with alcohol.  A steam or ultrasonic humidifier can help congestion.  You can place a towel over your head and breathe in the steam from hot water coming from a faucet.  Avoid close contacts especially the very young and the elderly.  Cover your mouth when you cough or sneeze.  Always remember to wash your hands.  Get Help Right Away If:  You develop worsening fever or sinus pain.  You develop a severe head ache or visual changes.  Your symptoms persist after you have completed your treatment plan.  Make sure you  Understand these instructions.  Will watch your condition.  Will get help right away if you are not doing well or get worse.  Your e-visit answers were reviewed by a board certified advanced clinical practitioner to complete your  personal care plan.  Depending on the condition, your plan could have included both over the counter or prescription medications.  If there is a problem please reply  once you have received a response from your provider.  Your safety is important to [place].  If you have drug allergies check your prescription carefully.    You can use MyChart to ask questions about today's visit, request a non-urgent call back, or ask for a work or school excuse for 24 hours related to this e-Visit. If it has been greater than 24 hours you will need to follow up with your provider, or enter a new e-Visit to address those concerns.  You will get an e-mail in the next two days asking about your experience.  I hope that your e-visit has been valuable and will speed your recovery. Thank you for using e-visits.   Approximately 5 minutes was spent documenting and reviewing patient's chart.

## 2020-06-21 ENCOUNTER — Other Ambulatory Visit: Payer: Self-pay

## 2020-06-21 ENCOUNTER — Ambulatory Visit: Payer: 59 | Admitting: Family Medicine

## 2020-06-21 ENCOUNTER — Encounter: Payer: Self-pay | Admitting: Family Medicine

## 2020-06-21 VITALS — BP 120/80 | HR 99 | Temp 98.5°F | Resp 17 | Ht 64.0 in | Wt 150.8 lb

## 2020-06-21 DIAGNOSIS — R41840 Attention and concentration deficit: Secondary | ICD-10-CM | POA: Diagnosis not present

## 2020-06-21 DIAGNOSIS — K581 Irritable bowel syndrome with constipation: Secondary | ICD-10-CM | POA: Diagnosis not present

## 2020-06-21 DIAGNOSIS — F32A Depression, unspecified: Secondary | ICD-10-CM

## 2020-06-21 MED ORDER — BUPROPION HCL ER (XL) 150 MG PO TB24: 150.0000 mg | ORAL_TABLET | Freq: Every day | ORAL | 3 refills | Status: DC

## 2020-06-21 MED ORDER — DICYCLOMINE HCL 20 MG PO TABS: 20.0000 mg | ORAL_TABLET | Freq: Three times a day (TID) | ORAL | 3 refills | Status: DC | PRN

## 2020-06-21 MED ORDER — LINACLOTIDE 72 MCG PO CAPS
72.0000 ug | ORAL_CAPSULE | Freq: Every day | ORAL | 3 refills | Status: AC
Start: 2020-06-21 — End: ?

## 2020-06-21 MED FILL — buPROPion HCL ER (XL) 150 M: 150 | 30 days supply | Qty: 30 | Fill #0

## 2020-06-21 MED FILL — LINZESS 72 MCG CAPSULE: 72 | 30 days supply | Qty: 30 | Fill #0

## 2020-06-21 MED FILL — DICYCLOMINE 20 MG TABLET: 20 | 20 days supply | Qty: 60 | Fill #0

## 2020-06-21 NOTE — Assessment & Plan Note (Signed)
Pt has always suspected some form of ADHD but has never been officially tested.  Her worsening mood has worsened her concentration/focus/task completion.  Will start Wellbutrin in hopes of treating both attention and depression.  Pt expressed understanding and is in agreement w/ plan.

## 2020-06-21 NOTE — Assessment & Plan Note (Signed)
New.  Pt's ongoing constipation, bloating, cramping is consistent w/ IBS.  Will start Dicyclomine prn and daily Linzess to improve constipation.  Pt expressed understanding and is in agreement w/ plan.

## 2020-06-21 NOTE — Assessment & Plan Note (Signed)
Deteriorated.  Pt is having a really hard time over the last 2 yrs and is struggling w/ motivation, productivity, mood.  She did not like how Zoloft made her feel.  Worried about weight gain.  Will start Welbutrin as she also notes difficulty w/ focus/attention/task completion.  She is in school in [place] and encouraged her to seek counseling/psychiatry out there.  Pt expressed understanding and is in agreement w/ plan.

## 2020-06-21 NOTE — Patient Instructions (Signed)
Follow up in 3-4 weeks to recheck mood (can be video visit) START the Wellbutrin once daily (in the morning) USE the dicyclomine as needed for abdominal pain/spasm/bloating START the Linzess once daily to improve constipation Look into psychiatry services at school (probably easier than doing it long distance) Call with any questions or concerns Hang in there!!!  You can do this! Happy New Year!
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
## 2020-06-28 MED FILL — SPIRONOLACTONE 50 MG TABLET: 50 | 90 days supply | Qty: 90 | Fill #0

## 2020-07-16 MED FILL — DICYCLOMINE 20 MG TABLET: 20 | 20 days supply | Qty: 60 | Fill #1 | Status: TO

## 2020-07-16 MED FILL — LINZESS 72 MCG CAPSULE: 72 | 30 days supply | Qty: 30 | Fill #1 | Status: TO

## 2020-07-16 MED FILL — buPROPion HCL ER (XL) 150 M: 150 | 30 days supply | Qty: 30 | Fill #1 | Status: TO

## 2020-07-28 ENCOUNTER — Telehealth: Payer: 59 | Admitting: Family Medicine

## 2020-07-28 ENCOUNTER — Encounter: Payer: Self-pay | Admitting: Family Medicine

## 2020-07-30 ENCOUNTER — Encounter: Payer: Self-pay | Admitting: Family Medicine

## 2020-07-30 ENCOUNTER — Other Ambulatory Visit: Payer: Self-pay | Admitting: Family Medicine

## 2020-07-30 ENCOUNTER — Telehealth (INDEPENDENT_AMBULATORY_CARE_PROVIDER_SITE_OTHER): Payer: 59 | Admitting: Family Medicine

## 2020-07-30 DIAGNOSIS — F1990 Other psychoactive substance use, unspecified, uncomplicated: Secondary | ICD-10-CM | POA: Diagnosis not present

## 2020-07-30 DIAGNOSIS — F32A Depression, unspecified: Secondary | ICD-10-CM

## 2020-07-30 MED ORDER — BUPROPION HCL ER (SR) 200 MG PO TB12: 200.0000 mg | ORAL_TABLET | Freq: Two times a day (BID) | ORAL | 3 refills | Status: DC

## 2020-07-30 MED FILL — BUPROPION HCL SR 200 MG TAB: 200 | 30 days supply | Qty: 60 | Fill #0
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
## 2020-07-30 NOTE — Progress Notes (Signed)
I connected with  Danielle Hickman on 07/30/20 by a video enabled telemedicine application and verified that I am speaking with the correct person using two identifiers.   I discussed the limitations of evaluation and management by telemedicine. The patient expressed understanding and agreed to proceed.

## 2020-08-04 DIAGNOSIS — Z114 Encounter for screening for human immunodeficiency virus [HIV]: Secondary | ICD-10-CM | POA: Diagnosis not present

## 2020-08-04 DIAGNOSIS — Z113 Encounter for screening for infections with a predominantly sexual mode of transmission: Secondary | ICD-10-CM | POA: Diagnosis not present

## 2020-08-04 DIAGNOSIS — L700 Acne vulgaris: Secondary | ICD-10-CM | POA: Diagnosis not present

## 2020-08-19 ENCOUNTER — Encounter: Payer: Self-pay | Admitting: Family Medicine

## 2020-08-19 DIAGNOSIS — Z114 Encounter for screening for human immunodeficiency virus [HIV]: Secondary | ICD-10-CM | POA: Diagnosis not present

## 2020-08-19 DIAGNOSIS — L700 Acne vulgaris: Secondary | ICD-10-CM | POA: Diagnosis not present

## 2020-08-19 DIAGNOSIS — Z113 Encounter for screening for infections with a predominantly sexual mode of transmission: Secondary | ICD-10-CM | POA: Diagnosis not present

## 2020-08-20 ENCOUNTER — Other Ambulatory Visit: Payer: Self-pay | Admitting: Physician Assistant

## 2020-08-20 ENCOUNTER — Other Ambulatory Visit: Payer: Self-pay

## 2020-08-20 ENCOUNTER — Telehealth (INDEPENDENT_AMBULATORY_CARE_PROVIDER_SITE_OTHER): Payer: 59 | Admitting: Physician Assistant

## 2020-08-20 ENCOUNTER — Encounter: Payer: Self-pay | Admitting: Physician Assistant

## 2020-08-20 DIAGNOSIS — G4489 Other headache syndrome: Secondary | ICD-10-CM

## 2020-08-20 DIAGNOSIS — G43109 Migraine with aura, not intractable, without status migrainosus: Secondary | ICD-10-CM | POA: Diagnosis not present

## 2020-08-20 DIAGNOSIS — R110 Nausea: Secondary | ICD-10-CM

## 2020-08-20 DIAGNOSIS — M62838 Other muscle spasm: Secondary | ICD-10-CM

## 2020-08-20 DIAGNOSIS — F338 Other recurrent depressive disorders: Secondary | ICD-10-CM

## 2020-08-20 MED ORDER — ONDANSETRON HCL 4 MG PO TABS: 4.0000 mg | ORAL_TABLET | Freq: Three times a day (TID) | ORAL | 1 refills | Status: DC | PRN

## 2020-08-20 MED ORDER — BACLOFEN 10 MG PO TABS: 10.0000 mg | ORAL_TABLET | Freq: Three times a day (TID) | ORAL | 11 refills | Status: DC | PRN

## 2020-08-20 MED ORDER — NURTEC 75 MG PO TBDP: 75.0000 mg | ORAL_TABLET | ORAL | 11 refills | Status: DC | PRN

## 2020-08-20 MED FILL — BACLOFEN 10 MG TABS: 10 | 13 days supply | Qty: 40 | Fill #0

## 2020-08-20 MED FILL — ONDANSETRON HCL 4 MG TABS: 4 | 6 days supply | Qty: 20 | Fill #0

## 2020-08-20 NOTE — Progress Notes (Signed)
TELEHEALTH GYNECOLOGY VISIT ENCOUNTER NOTE  Provider location: [facility]  I connected with Danielle Hickman on 08/20/20 at 11:45 AM EST by video conferencing at her apartment at college in [place] and verified that I am speaking with the correct person using two identifiers.    I discussed the limitations, risks, security and privacy concerns of performing an evaluation and management service by telephone and the availability of in person appointments. I also discussed with the patient that there may be a patient responsible charge related to this service. The patient expressed understanding and agreed to proceed.   History:  Danielle Hickman is a 25 y.o. G0P0000 female being evaluated today for headache.  She discontinued her OCP a year ago and feels like the aura related migraines have improved.  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   Tizanidine/Nurtec/Ubrelvy have all been tried and have been found helpful.  Zanaflex puts her to sleep.   When she had baclofen, the 10mg  was sometimes not enough.  She would use it twice in a day.  She has not had it in more than 6 months and maybe even a year.  She struggles with seasonal affective disorder in [place] this time of year.  She has tried changing Wellbutrin from 150mg  XR to 200mg  SR and that is causing her to feel more anxious and dizzy.     Past Medical History:  Diagnosis Date  . Allergy   . Depression   . Eczema   . IBS (irritable bowel syndrome)   . Migraines    History reviewed. No pertinent surgical history. The following portions of the patient's history were reviewed and updated as appropriate: allergies, current medications, past family history, past medical history, past social history, past surgical history and problem list.     Review of Systems:  Pertinent items noted in HPI and remainder of comprehensive ROS otherwise negative.  Physical Exam:   General:   Alert, oriented and cooperative.   Mental Status: Normal mood and affect perceived. Normal judgment and thought content.  Physical exam deferred due to nature of the encounter  Labs and Imaging No results found for this or any previous visit (from the past 336 hour(s)). No results found.    Assessment and Plan:     There are no diagnoses linked to this encounter.     1. Migraine with aura and without status migrainosus, not intractable   2. Nausea   3. Seasonal affective disorder ([place])   4. Headache associated with hormonal factors   5. Muscle spasm    Worsening of headaches overall.   Advised pt to get a light to use for daily light therapy to help with Seasonal Affective Disorder Use baclofen for muscle spasm/tension Begin yoga  Nurtec for migraine - can be used preventively - for hormonal headache Also can use ibuprofen preventively for menstrual related headache zofran for nausea with or without migraine F/u one year or prn as needed.    I discussed the assessment and treatment plan with the patient. The patient was provided an opportunity to ask questions and all were answered. The patient agreed with the plan and demonstrated an understanding of the instructions.   The patient was advised to call back or seek an in-person evaluation/go to the ED if the symptoms worsen or if the condition fails to improve as anticipated.  I provided 20 minutes of video face-to-face time during this encounter.  Paticia Stack, PA-C [facility], [place]

## 2020-08-20 NOTE — Patient Instructions (Signed)
Seasonal Affective Disorder Seasonal affective disorder (SAD) is a type of depression. It is when you feel depressed at specific times of the year. SAD is most common during late fall and winter when the days are shorter and most people spend less time outdoors. This is why SAD is also known as the "winter blues." SAD occurs less commonly in the spring or summer. SAD can be mild to severe, and it can interfere with work, school, relationships, and normal daily activities. What are the causes? The cause of this condition is not known. It may be related to changes in brain chemistry that are caused by having less exposure to daylight. What increases the risk? You are more likely to develop this condition if:  You are female.  You live far [place] or far [place] of the equ[place]. These areas get less sunlight and have longer winter seasons.  You have a personal history of depression or bipolar disorder.  You have a family history of mental health conditions. What are the signs or symptoms? Symptoms of this condition include:  Depressed mood, which may involve: ? Feeling sad or teary. ? Having crying spells.  Irritability.  Trouble sleeping, or sleeping more than usual.  Loss of interest in activities that you usually enjoy.  Feelings of guilt or worthlessness.  Restlessness or loss of energy.  Difficulty concentrating, remembering, or making decisions.  Significant change in appetite or weight.  Thinking about self-harm or attempting suicide. Symptoms associated with the winter pattern of SAD include:  Overeating or craving sweet foods.  Weight gain.  Avoiding social situations (social withdrawal), or feeling like "hibernating."  Sleeping more than usual. Symptoms associated with the less common summer pattern of SAD include:  Loss of appetite.  Weight loss.  Trouble sleeping.  Episodes of violent behavior (in severe cases). How is this diagnosed? This condition is  usually diagnosed through an assessment with your health care provider. You will be asked about your moods, thoughts, and behaviors. You will also be asked about your medical history, any major life changes, and any medicines and substances that you use. You may have a physical exam and blood tests to rule out other possible causes of your symptoms. You may be referred to a mental health specialist for more evaluation. How is this treated? Treatment for this condition may include:  Light therapy. This therapy involves sitting in front of a light source for 15-30 minutes every day. The light source may be: ? A light box. ? A dawn simulator or sunrise clock. This is a timer-activated light source that copies the sunrise by slowly becoming brighter. This can help to activate your body's internal clock.  Antidepressant medicine.  Cognitive behavioral therapy (CBT). CBT is a form of talk therapy that helps to identify and change negative thoughts that are associated with SAD.  Changes to your dietary, exercise, or sleeping habits. A healthy lifestyle may help to prevent or relieve symptoms.   Follow these instructions at home: Medicines  Take over-the-counter and prescription medicines only as told by your healthcare provider.  If you are taking antidepressant medicines, ask your health care provider what side effects you should be aware of.  Talk with your health care provider before you start taking any new prescription or over-the-counter medicines, herbs, or supplements. Lifestyle  Eat a healthy diet that includes fruits and vegetables, whole grains, and lean proteins.  Get plenty of sleep. To improve your sleep, make sure you: ? Keep your bedroom dark  and cool. ? Go to sleep and wake up at about the same time every day. ? Do not keep screens (such as a TV or smartphone) in your bedroom. Limit your screen time starting a few hours before bedtime.  Exercise regularly.  Limit alcohol  and caffeine as told by your health care provider. General instructions  Make your home and work environment as sunny or bright as possible. Open window blinds and move furniture closer to windows.  Spend as much time outside as possible.  Use light therapy for 15-30 minutes every day, or as often as directed.  Attend CBT therapy sessions as directed.  Keep all follow-up visits as told by your health care provider and therapist. This is important. Contact a health care provider if:  Your symptoms do not get better or they get worse.  You have trouble taking care of yourself.  You are using drugs or alcohol to cope with your symptoms.  You have side effects from medicines. Get help right away if:  You have thoughts about hurting yourself or others. If you ever feel like you may hurt yourself or others, or have thoughts about taking your own life, get help right away. You can go to your nearest emergency department or call:  Your local emergency services (911 in the [place]  A suicide crisis helpline, such as the [facility] at 754-336-8347. This is open 24 hours a day. Summary  Seasonal affective disorder (SAD) is a type of depression that is associated with specific times of the year (usually fall and winter).  This condition may be treated with light therapy, talk therapy, and antidepressant medicines.  To help treat your condition, take good care of yourself and make home and work as sunny and bright as possible.  Seek help right away if you have thoughts about hurting yourself or others. This information is not intended to replace advice given to you by your health care provider. Make sure you discuss any questions you have with your health care provider. Document Revised: 12/04/2019 Document Reviewed: 12/04/2019 Elsevier Patient Education  2021 [employer]. Migraine Headache A migraine headache is an intense, throbbing pain on one side or both  sides of the head. Migraine headaches may also cause other symptoms, such as nausea, vomiting, and sensitivity to light and noise. A migraine headache can last from 4 hours to 3 days. Talk with your doctor about what things may bring on (trigger) your migraine headaches. What are the causes? The exact cause of this condition is not known. However, a migraine may be caused when nerves in the brain become irritated and release chemicals that cause inflammation of blood vessels. This inflammation causes pain. This condition may be triggered or caused by:  Drinking alcohol.  Smoking.  Taking medicines, such as: ? Medicine used to treat chest pain (nitroglycerin). ? Birth control pills. ? Estrogen. ? Certain blood pressure medicines.  Eating or drinking products that contain nitrates, glutamate, aspartame, or tyramine. Aged cheeses, chocolate, or caffeine may also be triggers.  Doing physical activity. Other things that may trigger a migraine headache include:  Menstruation.  Pregnancy.  Hunger.  Stress.  Lack of sleep or too much sleep.  Weather changes.  Fatigue. What increases the risk? The following factors may make you more likely to experience migraine headaches:  Being a certain age. This condition is more common in people who are 56-59 years old.  Being female.  Having a family history of migraine headaches.  Being Caucasian.  Having a mental health condition, such as depression or anxiety.  Being obese. What are the signs or symptoms? The main symptom of this condition is pulsating or throbbing pain. This pain may:  Happen in any area of the head, such as on one side or both sides.  Interfere with daily activities.  Get worse with physical activity.  Get worse with exposure to bright lights or loud noises. Other symptoms may include:  Nausea.  Vomiting.  Dizziness.  General sensitivity to bright lights, loud noises, or smells. Before you get a  migraine headache, you may get warning signs (an aura). An aura may include:  Seeing flashing lights or having blind spots.  Seeing bright spots, halos, or zigzag lines.  Having tunnel vision or blurred vision.  Having numbness or a tingling feeling.  Having trouble talking.  Having muscle weakness. Some people have symptoms after a migraine headache (postdromal phase), such as:  Feeling tired.  Difficulty concentrating. How is this diagnosed? A migraine headache can be diagnosed based on:  Your symptoms.  A physical exam.  Tests, such as: ? CT scan or an MRI of the head. These imaging tests can help rule out other causes of headaches. ? Taking fluid from the spine (lumbar puncture) and analyzing it (cerebrospinal fluid analysis, or CSF analysis). How is this treated? This condition may be treated with medicines that:  Relieve pain.  Relieve nausea.  Prevent migraine headaches. Treatment for this condition may also include:  Acupuncture.  Lifestyle changes like avoiding foods that trigger migraine headaches.  Biofeedback.  Cognitive behavioral therapy. Follow these instructions at home: Medicines  Take over-the-counter and prescription medicines only as told by your health care provider.  Ask your health care provider if the medicine prescribed to you: ? Requires you to avoid driving or using heavy machinery. ? Can cause constipation. You may need to take these actions to prevent or treat constipation:  Drink enough fluid to keep your urine pale yellow.  Take over-the-counter or prescription medicines.  Eat foods that are high in fiber, such as beans, whole grains, and fresh fruits and vegetables.  Limit foods that are high in fat and processed sugars, such as fried or sweet foods. Lifestyle  Do not drink alcohol.  Do not use any products that contain nicotine or tobacco, such as cigarettes, e-cigarettes, and chewing tobacco. If you need help quitting,  ask your health care provider.  Get at least 8 hours of sleep every night.  Find ways to manage stress, such as meditation, deep breathing, or yoga. General instructions  Keep a journal to find out what may trigger your migraine headaches. For example, write down: ? What you eat and drink. ? How much sleep you get. ? Any change to your diet or medicines.  If you have a migraine headache: ? Avoid things that make your symptoms worse, such as bright lights. ? It may help to lie down in a dark, quiet room. ? Do not drive or use heavy machinery. ? Ask your health care provider what activities are safe for you while you are experiencing symptoms.  Keep all follow-up visits as told by your health care provider. This is important.      Contact a health care provider if:  You develop symptoms that are different or more severe than your usual migraine headache symptoms.  You have more than 15 headache days in one month. Get help right away if:  Your migraine headache becomes  severe.  Your migraine headache lasts longer than 72 hours.  You have a fever.  You have a stiff neck.  You have vision loss.  Your muscles feel weak or like you cannot control them.  You start to lose your balance often.  You have trouble walking.  You faint.  You have a seizure. Summary  A migraine headache is an intense, throbbing pain on one side or both sides of the head. Migraines may also cause other symptoms, such as nausea, vomiting, and sensitivity to light and noise.  This condition may be treated with medicines and lifestyle changes. You may also need to avoid certain things that trigger a migraine headache.  Keep a journal to find out what may trigger your migraine headaches.  Contact your health care provider if you have more than 15 headache days in a month or you develop symptoms that are different or more severe than your usual migraine headache symptoms. This information is not  intended to replace advice given to you by your health care provider. Make sure you discuss any questions you have with your health care provider. Document Revised: 10/04/2018 Document Reviewed: 07/25/2018 Elsevier Patient Education  [employer].

## 2020-08-23 ENCOUNTER — Other Ambulatory Visit: Payer: Self-pay | Admitting: Family Medicine

## 2020-08-23 MED ORDER — BUPROPION HCL ER (XL) 150 MG PO TB24: 150.0000 mg | ORAL_TABLET | Freq: Every day | ORAL | 1 refills | Status: DC

## 2020-08-23 MED FILL — buPROPion HCL ER (XL) 150 M: 150 | 90 days supply | Qty: 90 | Fill #0

## 2020-08-30 ENCOUNTER — Encounter: Payer: Self-pay | Admitting: *Deleted

## 2020-09-03 MED FILL — NURTEC 75 MG TBDP: 75 | 30 days supply | Qty: 8 | Fill #0

## 2020-09-15 DIAGNOSIS — R41840 Attention and concentration deficit: Secondary | ICD-10-CM | POA: Diagnosis not present

## 2020-09-15 DIAGNOSIS — R21 Rash and other nonspecific skin eruption: Secondary | ICD-10-CM | POA: Diagnosis not present

## 2020-09-15 DIAGNOSIS — F909 Attention-deficit hyperactivity disorder, unspecified type: Secondary | ICD-10-CM | POA: Diagnosis not present

## 2020-10-11 DIAGNOSIS — R509 Fever, unspecified: Secondary | ICD-10-CM | POA: Diagnosis not present

## 2020-10-11 DIAGNOSIS — Z1152 Encounter for screening for COVID-19: Secondary | ICD-10-CM | POA: Diagnosis not present

## 2020-10-11 DIAGNOSIS — J069 Acute upper respiratory infection, unspecified: Secondary | ICD-10-CM | POA: Diagnosis not present

## 2020-10-14 DIAGNOSIS — R051 Acute cough: Secondary | ICD-10-CM | POA: Diagnosis not present

## 2020-10-14 DIAGNOSIS — F411 Generalized anxiety disorder: Secondary | ICD-10-CM | POA: Insufficient documentation

## 2020-10-14 DIAGNOSIS — R5381 Other malaise: Secondary | ICD-10-CM | POA: Diagnosis not present

## 2020-10-14 DIAGNOSIS — Z20822 Contact with and (suspected) exposure to covid-19: Secondary | ICD-10-CM | POA: Diagnosis not present

## 2020-10-14 DIAGNOSIS — R509 Fever, unspecified: Secondary | ICD-10-CM | POA: Diagnosis not present

## 2020-10-14 DIAGNOSIS — F331 Major depressive disorder, recurrent, moderate: Secondary | ICD-10-CM | POA: Diagnosis not present

## 2020-10-14 DIAGNOSIS — J069 Acute upper respiratory infection, unspecified: Secondary | ICD-10-CM | POA: Diagnosis not present

## 2020-10-14 DIAGNOSIS — F3341 Major depressive disorder, recurrent, in partial remission: Secondary | ICD-10-CM | POA: Insufficient documentation

## 2020-10-14 DIAGNOSIS — J181 Lobar pneumonia, unspecified organism: Secondary | ICD-10-CM | POA: Diagnosis not present

## 2020-10-22 ENCOUNTER — Telehealth: Payer: 59 | Admitting: Physician Assistant

## 2020-11-11 DIAGNOSIS — G4710 Hypersomnia, unspecified: Secondary | ICD-10-CM | POA: Diagnosis not present

## 2020-11-11 DIAGNOSIS — F331 Major depressive disorder, recurrent, moderate: Secondary | ICD-10-CM | POA: Diagnosis not present

## 2020-11-11 DIAGNOSIS — R0683 Snoring: Secondary | ICD-10-CM | POA: Diagnosis not present

## 2020-11-15 ENCOUNTER — Other Ambulatory Visit (HOSPITAL_COMMUNITY): Payer: Self-pay

## 2020-11-15 MED FILL — Bupropion HCl Tab ER 24HR 150 MG: ORAL | 90 days supply | Qty: 90 | Fill #0 | Status: AC

## 2020-11-16 DIAGNOSIS — G4710 Hypersomnia, unspecified: Secondary | ICD-10-CM | POA: Diagnosis not present

## 2020-11-24 DIAGNOSIS — F902 Attention-deficit hyperactivity disorder, combined type: Secondary | ICD-10-CM | POA: Diagnosis not present

## 2020-11-24 DIAGNOSIS — F331 Major depressive disorder, recurrent, moderate: Secondary | ICD-10-CM | POA: Diagnosis not present

## 2020-11-24 DIAGNOSIS — F411 Generalized anxiety disorder: Secondary | ICD-10-CM | POA: Diagnosis not present

## 2020-12-22 ENCOUNTER — Encounter: Payer: Self-pay | Admitting: *Deleted

## 2021-01-11 DIAGNOSIS — F411 Generalized anxiety disorder: Secondary | ICD-10-CM | POA: Diagnosis not present

## 2021-01-11 DIAGNOSIS — F902 Attention-deficit hyperactivity disorder, combined type: Secondary | ICD-10-CM | POA: Diagnosis not present

## 2021-01-11 DIAGNOSIS — F331 Major depressive disorder, recurrent, moderate: Secondary | ICD-10-CM | POA: Diagnosis not present

## 2021-01-17 DIAGNOSIS — J0300 Acute streptococcal tonsillitis, unspecified: Secondary | ICD-10-CM | POA: Diagnosis not present

## 2021-01-18 DIAGNOSIS — J0300 Acute streptococcal tonsillitis, unspecified: Secondary | ICD-10-CM | POA: Diagnosis not present

## 2021-02-23 ENCOUNTER — Encounter: Payer: Self-pay | Admitting: *Deleted

## 2021-03-02 DIAGNOSIS — F902 Attention-deficit hyperactivity disorder, combined type: Secondary | ICD-10-CM | POA: Diagnosis not present

## 2021-03-15 DIAGNOSIS — F331 Major depressive disorder, recurrent, moderate: Secondary | ICD-10-CM | POA: Diagnosis not present

## 2021-03-15 DIAGNOSIS — F411 Generalized anxiety disorder: Secondary | ICD-10-CM | POA: Diagnosis not present

## 2021-03-15 DIAGNOSIS — F902 Attention-deficit hyperactivity disorder, combined type: Secondary | ICD-10-CM | POA: Diagnosis not present

## 2021-03-17 ENCOUNTER — Other Ambulatory Visit (HOSPITAL_COMMUNITY): Payer: Self-pay

## 2021-03-17 MED FILL — Baclofen Tab 10 MG: ORAL | 13 days supply | Qty: 40 | Fill #0 | Status: AC

## 2021-03-18 ENCOUNTER — Other Ambulatory Visit (HOSPITAL_COMMUNITY): Payer: Self-pay

## 2021-04-11 DIAGNOSIS — F411 Generalized anxiety disorder: Secondary | ICD-10-CM | POA: Diagnosis not present

## 2021-04-11 DIAGNOSIS — F3341 Major depressive disorder, recurrent, in partial remission: Secondary | ICD-10-CM | POA: Diagnosis not present

## 2021-04-11 DIAGNOSIS — F902 Attention-deficit hyperactivity disorder, combined type: Secondary | ICD-10-CM | POA: Diagnosis not present

## 2021-05-04 ENCOUNTER — Other Ambulatory Visit (HOSPITAL_COMMUNITY): Payer: Self-pay

## 2021-05-04 DIAGNOSIS — J069 Acute upper respiratory infection, unspecified: Secondary | ICD-10-CM | POA: Diagnosis not present

## 2021-05-04 DIAGNOSIS — R070 Pain in throat: Secondary | ICD-10-CM | POA: Diagnosis not present

## 2021-05-04 MED ORDER — CARESTART COVID-19 HOME TEST VI KIT
PACK | 0 refills | Status: AC
  Filled 2021-05-04 – 2021-05-16 (×2): qty 4, 4d supply, fill #0

## 2021-05-08 DIAGNOSIS — J029 Acute pharyngitis, unspecified: Secondary | ICD-10-CM | POA: Diagnosis not present

## 2021-05-12 ENCOUNTER — Other Ambulatory Visit (HOSPITAL_COMMUNITY): Payer: Self-pay

## 2021-05-12 DIAGNOSIS — F411 Generalized anxiety disorder: Secondary | ICD-10-CM | POA: Diagnosis not present

## 2021-05-12 DIAGNOSIS — F902 Attention-deficit hyperactivity disorder, combined type: Secondary | ICD-10-CM | POA: Diagnosis not present

## 2021-05-12 DIAGNOSIS — F3341 Major depressive disorder, recurrent, in partial remission: Secondary | ICD-10-CM | POA: Diagnosis not present

## 2021-05-16 ENCOUNTER — Other Ambulatory Visit (HOSPITAL_COMMUNITY): Payer: Self-pay

## 2021-05-17 ENCOUNTER — Ambulatory Visit: Payer: 59 | Admitting: Family Medicine

## 2021-05-24 DIAGNOSIS — Z7184 Encounter for health counseling related to travel: Secondary | ICD-10-CM | POA: Diagnosis not present

## 2021-06-14 DIAGNOSIS — F3341 Major depressive disorder, recurrent, in partial remission: Secondary | ICD-10-CM | POA: Diagnosis not present

## 2021-06-14 DIAGNOSIS — F902 Attention-deficit hyperactivity disorder, combined type: Secondary | ICD-10-CM | POA: Diagnosis not present

## 2021-06-14 DIAGNOSIS — F411 Generalized anxiety disorder: Secondary | ICD-10-CM | POA: Diagnosis not present

## 2021-06-17 DIAGNOSIS — Z23 Encounter for immunization: Secondary | ICD-10-CM | POA: Diagnosis not present

## 2021-09-20 ENCOUNTER — Other Ambulatory Visit (HOSPITAL_COMMUNITY): Payer: Self-pay

## 2021-09-20 MED ORDER — ESCITALOPRAM OXALATE 20 MG PO TABS
ORAL_TABLET | ORAL | 0 refills | Status: DC
  Filled 2021-09-20: qty 30, 30d supply, fill #0

## 2021-10-25 DIAGNOSIS — F411 Generalized anxiety disorder: Secondary | ICD-10-CM | POA: Diagnosis not present

## 2021-10-25 DIAGNOSIS — F3341 Major depressive disorder, recurrent, in partial remission: Secondary | ICD-10-CM | POA: Diagnosis not present

## 2021-10-25 DIAGNOSIS — F902 Attention-deficit hyperactivity disorder, combined type: Secondary | ICD-10-CM | POA: Diagnosis not present

## 2021-12-17 DIAGNOSIS — Z113 Encounter for screening for infections with a predominantly sexual mode of transmission: Secondary | ICD-10-CM | POA: Diagnosis not present

## 2021-12-17 DIAGNOSIS — Z0000 Encounter for general adult medical examination without abnormal findings: Secondary | ICD-10-CM | POA: Diagnosis not present

## 2022-01-21 DIAGNOSIS — R1030 Lower abdominal pain, unspecified: Secondary | ICD-10-CM | POA: Diagnosis not present

## 2022-01-21 DIAGNOSIS — R079 Chest pain, unspecified: Secondary | ICD-10-CM | POA: Diagnosis not present

## 2022-01-21 DIAGNOSIS — Z87891 Personal history of nicotine dependence: Secondary | ICD-10-CM | POA: Diagnosis not present

## 2022-02-09 DIAGNOSIS — M531 Cervicobrachial syndrome: Secondary | ICD-10-CM | POA: Diagnosis not present

## 2022-02-09 DIAGNOSIS — M47817 Spondylosis without myelopathy or radiculopathy, lumbosacral region: Secondary | ICD-10-CM | POA: Diagnosis not present

## 2022-02-18 DIAGNOSIS — M50322 Other cervical disc degeneration at C5-C6 level: Secondary | ICD-10-CM | POA: Diagnosis not present

## 2022-02-18 DIAGNOSIS — M532X1 Spinal instabilities, occipito-atlanto-axial region: Secondary | ICD-10-CM | POA: Diagnosis not present

## 2022-03-02 ENCOUNTER — Other Ambulatory Visit (HOSPITAL_COMMUNITY): Payer: Self-pay

## 2022-03-06 DIAGNOSIS — R21 Rash and other nonspecific skin eruption: Secondary | ICD-10-CM | POA: Diagnosis not present

## 2022-03-09 DIAGNOSIS — F321 Major depressive disorder, single episode, moderate: Secondary | ICD-10-CM | POA: Diagnosis not present

## 2022-03-15 ENCOUNTER — Other Ambulatory Visit (HOSPITAL_COMMUNITY): Payer: Self-pay

## 2022-03-16 DIAGNOSIS — F321 Major depressive disorder, single episode, moderate: Secondary | ICD-10-CM | POA: Diagnosis not present

## 2022-03-23 DIAGNOSIS — F321 Major depressive disorder, single episode, moderate: Secondary | ICD-10-CM | POA: Diagnosis not present

## 2022-04-06 DIAGNOSIS — F321 Major depressive disorder, single episode, moderate: Secondary | ICD-10-CM | POA: Diagnosis not present

## 2022-04-12 DIAGNOSIS — F321 Major depressive disorder, single episode, moderate: Secondary | ICD-10-CM | POA: Diagnosis not present

## 2022-04-20 DIAGNOSIS — F321 Major depressive disorder, single episode, moderate: Secondary | ICD-10-CM | POA: Diagnosis not present

## 2022-04-28 ENCOUNTER — Telehealth: Payer: 59 | Admitting: Family Medicine

## 2022-04-28 ENCOUNTER — Encounter: Payer: Self-pay | Admitting: Family Medicine

## 2022-04-28 DIAGNOSIS — F3341 Major depressive disorder, recurrent, in partial remission: Secondary | ICD-10-CM | POA: Diagnosis not present

## 2022-04-28 DIAGNOSIS — N946 Dysmenorrhea, unspecified: Secondary | ICD-10-CM | POA: Diagnosis not present

## 2022-04-28 DIAGNOSIS — D259 Leiomyoma of uterus, unspecified: Secondary | ICD-10-CM

## 2022-04-28 MED ORDER — ESCITALOPRAM OXALATE 10 MG PO TABS: 10.0000 mg | ORAL_TABLET | Freq: Every day | ORAL | 3 refills | Status: DC
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
## 2022-05-09 DIAGNOSIS — F321 Major depressive disorder, single episode, moderate: Secondary | ICD-10-CM | POA: Diagnosis not present

## 2022-05-15 DIAGNOSIS — F321 Major depressive disorder, single episode, moderate: Secondary | ICD-10-CM | POA: Diagnosis not present

## 2022-05-23 DIAGNOSIS — F321 Major depressive disorder, single episode, moderate: Secondary | ICD-10-CM | POA: Diagnosis not present

## 2022-05-25 ENCOUNTER — Telehealth: Payer: Self-pay

## 2022-05-25 NOTE — Telephone Encounter (Signed)
Information is found in Care Everywhere, under Other Results, and the information they need is the 01/21/22 CT report

## 2022-05-25 NOTE — Telephone Encounter (Signed)
[facility] declined to schedule patient without CT scan showing her fibroids no scan in the chart unsure where this information is coming from please advise

## 2022-05-25 NOTE — Telephone Encounter (Signed)
Printed and called [facility], faxed to them to continue for scheduling faxed CT to [phone]

## 2022-05-29 DIAGNOSIS — F321 Major depressive disorder, single episode, moderate: Secondary | ICD-10-CM | POA: Diagnosis not present

## 2022-06-01 DIAGNOSIS — F321 Major depressive disorder, single episode, moderate: Secondary | ICD-10-CM | POA: Diagnosis not present

## 2022-06-08 DIAGNOSIS — F321 Major depressive disorder, single episode, moderate: Secondary | ICD-10-CM | POA: Diagnosis not present

## 2022-08-29 ENCOUNTER — Other Ambulatory Visit (HOSPITAL_COMMUNITY): Payer: Self-pay

## 2022-08-29 ENCOUNTER — Other Ambulatory Visit: Payer: Self-pay | Admitting: Family Medicine

## 2022-08-29 ENCOUNTER — Encounter: Payer: Self-pay | Admitting: Family Medicine

## 2022-08-29 MED ORDER — ESCITALOPRAM OXALATE 10 MG PO TABS
10.0000 mg | ORAL_TABLET | Freq: Every day | ORAL | 3 refills | Status: AC
  Filled 2022-08-29: qty 30, 30d supply, fill #0

## 2022-08-29 NOTE — Telephone Encounter (Signed)
Patient is requesting a refill of the following medications: Requested Prescriptions   Pending Prescriptions Disp Refills   escitalopram (LEXAPRO) 10 MG tablet 30 tablet 3    Sig: Take 1 tablet (10 mg total) by mouth daily.    Date of patient request: 08/29/22 Last office visit: 04/28/22 video,  06/17/21 in office Date of last refill: 04/28/22 Last refill amount: 30    Message from pt:  Hi Dr. Birdie Riddle,  I have 4 pills left in the last refill of the 10 mg lexapro that you prescribed me a few months ago. I do not have the availability to come in for an appointment in the next week, since I live in [place] and it's a 2.5 hour drive, and I am the sole employee at my job (I'm a [employer]or, [employer], there is no one to cover my shift.) I am still interested in going off the medication but I am wondering if you can send one more months refill and instructions for titration. I really do not want to go through withdrawal.  Thanks,  Danielle Hickman

## 2022-08-29 NOTE — Telephone Encounter (Signed)
Encourage patient to contact the pharmacy for refills or they can request refills through [facility]: Pharmacy: [facility]  [place], [place] [address] [phone]  MEDICATION NAME & DOSE:escitalopram (LEXAPRO) 10 MG tablet   NOTES/COMMENTS FROM PATIENT:      [place] office please notify patient: It takes 48-72 hours to process rx refill requests Ask patient to call pharmacy to ensure rx is ready before heading there.

## 2022-08-29 NOTE — Telephone Encounter (Signed)
Pt informed

## 2022-08-31 ENCOUNTER — Other Ambulatory Visit (HOSPITAL_COMMUNITY): Payer: Self-pay

## 2022-09-11 ENCOUNTER — Other Ambulatory Visit (HOSPITAL_COMMUNITY): Payer: Self-pay

## 2023-04-11 ENCOUNTER — Other Ambulatory Visit: Payer: Self-pay
